# Patient Record
Sex: Female | Born: 1990 | Race: White | Hispanic: No | Marital: Single | Smoking: Never smoker
Health system: Southern US, Community
[De-identification: ages and names within clinical notes are randomized; demographics above are authoritative.]

## PROBLEM LIST (undated history)

## (undated) ENCOUNTER — Inpatient Hospital Stay: Payer: Self-pay

## (undated) DIAGNOSIS — Z3490 Encounter for supervision of normal pregnancy, unspecified, unspecified trimester: Secondary | ICD-10-CM

---
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
## 2010-04-10 ENCOUNTER — Ambulatory Visit: Payer: Self-pay | Admitting: Family Medicine

## 2010-04-22 ENCOUNTER — Emergency Department: Payer: Self-pay | Admitting: Emergency Medicine

## 2010-04-25 LAB — PATHOLOGY REPORT

## 2010-07-20 ENCOUNTER — Emergency Department: Payer: Self-pay | Admitting: Emergency Medicine

## 2010-08-18 ENCOUNTER — Emergency Department: Payer: Self-pay | Admitting: Emergency Medicine

## 2010-08-22 ENCOUNTER — Emergency Department: Payer: Self-pay | Admitting: Emergency Medicine

## 2010-10-17 ENCOUNTER — Emergency Department: Payer: Self-pay | Admitting: Emergency Medicine

## 2011-01-05 ENCOUNTER — Observation Stay: Payer: Self-pay | Admitting: Obstetrics and Gynecology

## 2011-02-22 ENCOUNTER — Observation Stay: Payer: Self-pay

## 2011-03-18 ENCOUNTER — Inpatient Hospital Stay: Payer: Self-pay | Admitting: Obstetrics and Gynecology

## 2011-09-04 ENCOUNTER — Emergency Department: Payer: Self-pay | Admitting: *Deleted

## 2011-09-04 LAB — URINALYSIS, COMPLETE
Bilirubin,UR: NEGATIVE
Ketone: NEGATIVE
Ph: 6 (ref 4.5–8.0)
Protein: NEGATIVE
RBC,UR: 2 /HPF (ref 0–5)
Squamous Epithelial: 16

## 2011-10-11 ENCOUNTER — Observation Stay: Payer: Self-pay

## 2011-10-11 LAB — URINALYSIS, COMPLETE
Bilirubin,UR: NEGATIVE
Blood: NEGATIVE
Glucose,UR: NEGATIVE mg/dL (ref 0–75)
RBC,UR: 1 /HPF (ref 0–5)
Specific Gravity: 1.001 (ref 1.003–1.030)

## 2011-10-11 LAB — CBC
HCT: 32.6 % — ABNORMAL LOW (ref 35.0–47.0)
HGB: 11.2 g/dL — ABNORMAL LOW (ref 12.0–16.0)
MCH: 33.4 pg (ref 26.0–34.0)
MCHC: 34.5 g/dL (ref 32.0–36.0)
MCV: 97 fL (ref 80–100)
Platelet: 250 10*3/uL (ref 150–440)
RBC: 3.37 10*6/uL — ABNORMAL LOW (ref 3.80–5.20)
WBC: 7.3 10*3/uL (ref 3.6–11.0)

## 2012-01-11 ENCOUNTER — Encounter (HOSPITAL_COMMUNITY): Payer: Self-pay | Admitting: *Deleted

## 2012-01-11 ENCOUNTER — Emergency Department (HOSPITAL_COMMUNITY): Payer: Medicaid Other

## 2012-01-11 ENCOUNTER — Emergency Department (HOSPITAL_COMMUNITY)
Admission: EM | Admit: 2012-01-11 | Discharge: 2012-01-11 | Disposition: A | Discharge status: Home / Self Care | Payer: Medicaid Other | Attending: Emergency Medicine | Admitting: Emergency Medicine

## 2012-01-11 DIAGNOSIS — H9209 Otalgia, unspecified ear: Secondary | ICD-10-CM | POA: Insufficient documentation

## 2012-01-11 DIAGNOSIS — R591 Generalized enlarged lymph nodes: Secondary | ICD-10-CM

## 2012-01-11 DIAGNOSIS — J029 Acute pharyngitis, unspecified: Secondary | ICD-10-CM | POA: Insufficient documentation

## 2012-01-11 DIAGNOSIS — R599 Enlarged lymph nodes, unspecified: Secondary | ICD-10-CM | POA: Insufficient documentation

## 2012-01-11 DIAGNOSIS — L989 Disorder of the skin and subcutaneous tissue, unspecified: Secondary | ICD-10-CM | POA: Insufficient documentation

## 2012-01-11 DIAGNOSIS — R6884 Jaw pain: Secondary | ICD-10-CM | POA: Insufficient documentation

## 2012-01-11 DIAGNOSIS — R259 Unspecified abnormal involuntary movements: Secondary | ICD-10-CM | POA: Insufficient documentation

## 2012-01-11 HISTORY — DX: Encounter for supervision of normal pregnancy, unspecified, unspecified trimester: Z34.90

## 2012-01-11 LAB — CBC WITH DIFFERENTIAL/PLATELET
Basophils Absolute: 0 10*3/uL (ref 0.0–0.1)
Basophils Relative: 0 % (ref 0–1)
Eosinophils Absolute: 0 10*3/uL (ref 0.0–0.7)
HCT: 32.3 % — ABNORMAL LOW (ref 36.0–46.0)
MCHC: 34.4 g/dL (ref 30.0–36.0)
Monocytes Absolute: 0.4 10*3/uL (ref 0.1–1.0)
Neutro Abs: 3.3 10*3/uL (ref 1.7–7.7)
RDW: 12.2 % (ref 11.5–15.5)

## 2012-01-11 LAB — BASIC METABOLIC PANEL
Calcium: 8.9 mg/dL (ref 8.4–10.5)
Chloride: 108 mEq/L (ref 96–112)
Creatinine, Ser: 0.58 mg/dL (ref 0.50–1.10)
GFR calc Af Amer: >90 mL/min (ref >90)
GFR calc non Af Amer: >90 mL/min (ref >90)

## 2012-01-11 MED ORDER — HYDROCODONE-ACETAMINOPHEN 5-325 MG PO TABS
1.0000 | ORAL_TABLET | Freq: Once | ORAL | Status: AC
  Administered 2012-01-11: 1 via ORAL
  Filled 2012-01-11: qty 1

## 2012-01-11 MED ORDER — HYDROCODONE-ACETAMINOPHEN 5-325 MG PO TABS: 1.0000 | ORAL_TABLET | ORAL | Status: AC | PRN

## 2012-01-11 NOTE — ED Provider Notes (Signed)
History     CSN: [id number]  Arrival date & time 01/11/12  1440   First MD Initiated Contact with Patient 01/11/12 1604      Chief Complaint  Patient presents with  . Abscess    (Consider location/radiation/quality/duration/timing/severity/associated sxs/prior treatment) HPI Comments: [name] presents with a swollen tender nodule which has been present behind her left ear for the past month and has had intermittent drainage from her left ear.  Over the past 3-4 days, she has had increased pain which is now radiating into her left jaw and down the left side of her throat which is worse with swallowing. She has had reduced PO intake secondary to pain.  She denies tongue or throat swelling and denies shortness of breath.   She cannot open her mouth because her jaw is painful.  She reports subjective fevers which she has not checked.  She has taken tylenol without relief of pain. She is currently 8.5 months pregnant and denies any problems with her pregnancy including current abdominal pain or cramping.    The history is provided by the patient.    Past Medical History  Diagnosis Date  . Pregnant     History reviewed. No pertinent past surgical history.  History reviewed. No pertinent family history.  History  Substance Use Topics  . Smoking status: Never Smoker   . Smokeless tobacco: Not on file  . Alcohol Use: No    OB History    Grav Para Term Preterm Abortions TAB SAB Ect Mult Living   1               Review of Systems  Constitutional: Positive for fever.  HENT: Positive for ear pain and sore throat. Negative for congestion, facial swelling, neck pain, dental problem and sinus pressure.   Eyes: Negative.   Respiratory: Negative for chest tightness and shortness of breath.   Cardiovascular: Negative for chest pain.  Gastrointestinal: Negative for nausea and abdominal pain.  Genitourinary: Negative.   Musculoskeletal: Negative for joint swelling and  arthralgias.  Skin: Negative.  Negative for rash and wound.  Neurological: Negative for dizziness, weakness, light-headedness, numbness and headaches.  Hematological: Negative.   Psychiatric/Behavioral: Negative.     Allergies  Review of patient's allergies indicates no known allergies.  Home Medications   Current Outpatient Rx  Name Route Sig Dispense Refill  . ACETAMINOPHEN 500 MG PO TABS Oral Take 500 mg by mouth every 6 (six) hours as needed. For pain    . PRENATAL PLUS 27-1 MG PO TABS Oral Take 1 tablet by mouth every morning.    Marland Kitchen HYDROCODONE-ACETAMINOPHEN 5-325 MG PO TABS Oral Take 1 tablet by mouth every 4 (four) hours as needed for pain. 15 tablet 0    BP 107/70  Pulse 81  Temp 97.6 F (36.4 C) (Oral)  Resp 20  Ht 5' (1.524 m)  Wt 124 lb (56.246 kg)  BMI 24.22 kg/m2  SpO2 99%  LMP 05/24/2011  Physical Exam  Nursing note and vitals reviewed. Constitutional: She appears well-developed and well-nourished.  HENT:  Head: Normocephalic and atraumatic.  Right Ear: Tympanic membrane, external ear and ear canal normal.  Left Ear: Tympanic membrane and ear canal normal. There is tenderness. There is mastoid tenderness.  Mouth/Throat: No oropharyngeal exudate.       Firm but mobile nodule over  left mastoid, non draining with no erythema. Also has small papule left scalp near border of hairline behind the left ear.  Patient unable to extend jaw more than 1 cm with ttp at left TM joint.  No edema.  Difficult to visualize tonsils competely , but no erythema or tonsillar exudate appreciated.  Eyes: Conjunctivae are normal.  Neck: Normal range of motion. No rigidity. No edema and no erythema present.  Cardiovascular: Normal rate, regular rhythm, normal heart sounds and intact distal pulses.   Pulmonary/Chest: Effort normal and breath sounds normal. She has no wheezes.  Abdominal: Soft. Bowel sounds are normal. There is no tenderness.  Musculoskeletal: Normal range of motion.    Neurological: She is alert.  Skin: Skin is warm and dry.  Psychiatric: She has a normal mood and affect.    ED Course  Procedures (including critical care time)  Labs Reviewed  CBC WITH DIFFERENTIAL - Abnormal; Notable for the following:    RBC 3.40 (*)     Hemoglobin 11.1 (*)     HCT 32.3 (*)     All other components within normal limits  BASIC METABOLIC PANEL - Abnormal; Notable for the following:    Potassium 3.4 (*)     BUN 3 (*)     All other components within normal limits   Ct Maxillofacial W/cm  01/11/2012  *RADIOLOGY REPORT*  Clinical Data: Trismus.  Adenopathy.  Palpable abnormality behind left ear.  Pain left-sided.  CT MAXILLOFACIAL WITH CONTRAST  Technique:  Multidetector CT imaging of the maxillofacial structures was performed with intravenous contrast. Multiplanar CT image reconstructions were also generated.  Contrast:  Omnipaque-300 IV  Comparison: None.  Findings: Peritonsillar soft tissues are symmetric and normal. Epiglottis is normal.  Left level II node measures 7 x 14 mm.  Right level II node measures 10.6 mm.  No low density or abscessed lymph nodes are present. 6 x 10 mm enhancing dermal lesion lateral to the mastoid tip may represent a lymph node which is inflamed.  No dermal abscess is present.  The orbit is intact bilaterally.  Paranasal sinuses are clear. Mastoid sinus is clear bilaterally.  Middle ears clear bilaterally. No acute bony change.  IMPRESSION: Shotty cervical adenopathy.  No pathologic lymph node or abscess is identified.  There is an enhancing dermal lesion lateral to the mastoid tip on the left which may be an inflamed lymph node.  Original Report Authenticated By: Camelia Phenes, M.D.     1. Jaw pain   2. Lymphadenopathy       MDM  Ct scan reviewed and discussed with radiologist and Dr Deretha Emory.  Ct scan negative for deep soft tissue abscess.  Pt's VSS in ed.  No evidence for infectious process at this time.  Pt was encouraged to contact  her obgyn to inform of this problem  But also referred to Dr Suszanne Conners for a recheck of her sx in 3 days.  However,  Advised to return here for a recheck sooner if her sx worsen in any way.  Hydrocodone prescribed with precautions.        Burgess Amor, [place] 01/12/12 [phone]

## 2012-01-11 NOTE — ED Notes (Signed)
Pt back from x-ray. Pt alert and oriented x 3. Skin warm and dry. Color pink. Pt is 8 1/2 months pregnant. No c/o cramping or bleeding. Pt states that she has been feeling the baby move today. Pt states that she has had a bump behind her left ear for a couple days and then started having pain in her mouth this morning and it is hard to open her jaws. Pt ambulating with no difficulty.

## 2012-01-11 NOTE — ED Notes (Signed)
Patient has small whitehead behind L ear at edge of hairline.  Slight swelling of lymph nodes on that side.  Difficulty opening mouth and painful swallowing.

## 2012-01-11 NOTE — ED Notes (Signed)
Swollen area behind lt ear, present over 1 month and drainage from lt ear. Pt is pregnant

## 2012-01-11 NOTE — ED Notes (Signed)
Transported patient via stretcher to RM 7 on acute side.

## 2012-01-12 ENCOUNTER — Observation Stay: Payer: Self-pay | Admitting: Obstetrics and Gynecology

## 2012-01-12 LAB — URINALYSIS, COMPLETE
Bacteria: NONE SEEN
Ketone: NEGATIVE
Leukocyte Esterase: NEGATIVE
Ph: 7 (ref 4.5–8.0)
Protein: NEGATIVE
RBC,UR: <1 /HPF (ref 0–5)
Transitional Epi: <1
WBC UR: NONE SEEN /HPF (ref 0–5)

## 2012-01-12 NOTE — ED Provider Notes (Signed)
Medical screening examination/treatment/procedure(s) were performed by non-physician practitioner and as supervising physician I was immediately available for consultation/collaboration.   Shelda Jakes, MD 01/12/12 1950

## 2012-02-09 ENCOUNTER — Inpatient Hospital Stay: Payer: Self-pay | Admitting: Obstetrics & Gynecology

## 2012-02-09 LAB — CBC WITH DIFFERENTIAL/PLATELET
Basophil #: 0 10*3/uL (ref 0.0–0.1)
Eosinophil #: 0 10*3/uL (ref 0.0–0.7)
MCH: 31.7 pg (ref 26.0–34.0)
Monocyte #: 0.4 x10 3/mm (ref 0.2–0.9)
Monocyte %: 5.8 %
Neutrophil %: 71.7 %
Platelet: 250 10*3/uL (ref 150–440)
RBC: 3.4 10*6/uL — ABNORMAL LOW (ref 3.80–5.20)
RDW: 11.6 % (ref 11.5–14.5)

## 2012-02-09 LAB — DRUG SCREEN, URINE
Amphetamines, Ur Screen: NEGATIVE (ref ?–1000)
Benzodiazepine, Ur Scrn: NEGATIVE (ref ?–200)
Cannabinoid 50 Ng, Ur ~~LOC~~: NEGATIVE (ref ?–50)
Cocaine Metabolite,Ur ~~LOC~~: NEGATIVE (ref ?–300)
MDMA (Ecstasy)Ur Screen: NEGATIVE (ref ?–500)
Phencyclidine (PCP) Ur S: NEGATIVE (ref ?–25)

## 2012-05-16 ENCOUNTER — Emergency Department (HOSPITAL_COMMUNITY): Payer: No Typology Code available for payment source

## 2012-05-16 ENCOUNTER — Encounter (HOSPITAL_COMMUNITY): Payer: Self-pay

## 2012-05-16 ENCOUNTER — Emergency Department (HOSPITAL_COMMUNITY)
Admission: EM | Admit: 2012-05-16 | Discharge: 2012-05-16 | Disposition: A | Discharge status: Home / Self Care | Payer: No Typology Code available for payment source | Attending: Emergency Medicine | Admitting: Emergency Medicine

## 2012-05-16 DIAGNOSIS — R0789 Other chest pain: Secondary | ICD-10-CM

## 2012-05-16 DIAGNOSIS — H9209 Otalgia, unspecified ear: Secondary | ICD-10-CM

## 2012-05-16 DIAGNOSIS — R05 Cough: Secondary | ICD-10-CM | POA: Insufficient documentation

## 2012-05-16 DIAGNOSIS — R109 Unspecified abdominal pain: Secondary | ICD-10-CM | POA: Insufficient documentation

## 2012-05-16 DIAGNOSIS — R071 Chest pain on breathing: Secondary | ICD-10-CM | POA: Insufficient documentation

## 2012-05-16 DIAGNOSIS — J40 Bronchitis, not specified as acute or chronic: Secondary | ICD-10-CM

## 2012-05-16 DIAGNOSIS — R059 Cough, unspecified: Secondary | ICD-10-CM | POA: Insufficient documentation

## 2012-05-16 DIAGNOSIS — Z3202 Encounter for pregnancy test, result negative: Secondary | ICD-10-CM | POA: Insufficient documentation

## 2012-05-16 LAB — PREGNANCY, URINE: Preg Test, Ur: NEGATIVE

## 2012-05-16 MED ORDER — AZITHROMYCIN 250 MG PO TABS: ORAL_TABLET | ORAL | Status: DC

## 2012-05-16 MED ORDER — AZITHROMYCIN 250 MG PO TABS
500.0000 mg | ORAL_TABLET | Freq: Once | ORAL | Status: AC
  Administered 2012-05-16: 500 mg via ORAL
  Filled 2012-05-16: qty 2

## 2012-05-16 MED ORDER — ALBUTEROL SULFATE (5 MG/ML) 0.5% IN NEBU
2.5000 mg | INHALATION_SOLUTION | Freq: Once | RESPIRATORY_TRACT | Status: AC
  Administered 2012-05-16: 2.5 mg via RESPIRATORY_TRACT
  Filled 2012-05-16: qty 0.5

## 2012-05-16 MED ORDER — IBUPROFEN 800 MG PO TABS
800.0000 mg | ORAL_TABLET | Freq: Once | ORAL | Status: AC
  Administered 2012-05-16: 800 mg via ORAL
  Filled 2012-05-16: qty 1

## 2012-05-16 NOTE — ED Provider Notes (Signed)
History     CSN: [id number]  Arrival date & time 05/16/12  0022   First MD Initiated Contact with Patient 05/16/12 [phone]      Chief Complaint  Patient presents with  . Rib Pain   . Otalgia  . Post-op Problem    (Consider location/radiation/quality/duration/timing/severity/associated sxs/prior treatment) HPI  [name] is a 21 y.o. female who presents to the Emergency Department complaining of cough, left rib pain, pain at her C section scar, and left ear pain. She denies fever, chills, nausea, vomiting, shortness of breath. She has taken no medicines.   History reviewed. No pertinent past medical history.  Past Surgical History  Procedure Date  . Cesarean section     No family history on file.  History  Substance Use Topics  . Smoking status: Never Smoker   . Smokeless tobacco: Not on file  . Alcohol Use: No    OB History    Grav Para Term Preterm Abortions TAB SAB Ect Mult Living   1               Review of Systems  Constitutional: Negative for fever.       10 Systems reviewed and are negative for acute change except as noted in the HPI.  HENT: Positive for ear pain. Negative for congestion.   Eyes: Negative for discharge and redness.  Respiratory: Positive for cough. Negative for shortness of breath.        Right rib pain  Cardiovascular: Negative for chest pain.  Gastrointestinal: Positive for abdominal pain. Negative for vomiting.  Musculoskeletal: Negative for back pain.  Skin: Negative for rash.  Neurological: Negative for syncope, numbness and headaches.  Psychiatric/Behavioral:       No behavior change.    Allergies  Review of patient's allergies indicates no known allergies.  Home Medications   Current Outpatient Rx  Name  Route  Sig  Dispense  Refill  . ACETAMINOPHEN 500 MG PO TABS   Oral   Take 500 mg by mouth every 6 (six) hours as needed. For pain         . AZITHROMYCIN 250 MG PO TABS      1 every day until finished.   4  tablet   0   . PRENATAL PLUS 27-1 MG PO TABS   Oral   Take 1 tablet by mouth every morning.           BP 138/79  Pulse 88  Temp 98.1 F (36.7 C) (Oral)  Resp 16  Ht 4\' 11"  (1.499 m)  Wt 110 lb (49.896 kg)  BMI 22.22 kg/m2  SpO2 100%  LMP 05/24/2011  Breastfeeding? Unknown  Physical Exam  Nursing note and vitals reviewed. Constitutional: She appears well-developed and well-nourished.       Awake, alert, nontoxic appearance.  HENT:  Head: Atraumatic.  Right Ear: External ear normal.  Left Ear: External ear normal.  Mouth/Throat: Oropharynx is clear and moist.  Eyes: Right eye exhibits no discharge. Left eye exhibits no discharge.  Neck: Neck supple.  Pulmonary/Chest: Effort normal. No respiratory distress. She has no wheezes. She has no rales. She exhibits tenderness.       Left costal margin tenderness to palpation. No crepitus.   Abdominal: Soft. There is no tenderness. There is no rebound.  Genitourinary:       C section scar well healed, no erythema, no drainage.   Musculoskeletal: She exhibits no tenderness.       Baseline  ROM, no obvious new focal weakness.  Neurological:       Mental status and motor strength appears baseline for patient and situation.  Skin: No rash noted.  Psychiatric: She has a normal mood and affect.    ED Course  Procedures (including critical care time)   Labs Reviewed  PREGNANCY, URINE   Dg Chest 2 View  05/16/2012  *RADIOLOGY REPORT*  Clinical Data: Cough, chest pain.  CHEST - 2 VIEW  Comparison: None.  Findings: Lungs are clear. No pleural effusion or pneumothorax. The cardiomediastinal contours are within normal limits. The visualized bones and soft tissues are without significant appreciable abnormality.  IMPRESSION: No radiographic evidence of acute cardiopulmonary process.   Original Report Authenticated By: Jearld Lesch, M.D.      1. Bronchitis   2. Chest wall pain   3. Earache       MDM  Patient with cough x  several days here with chest wall pain, ear ache and cough. Chest xray unremarkable. Given albuterol nebulizer treatment with improvement in cough. Initiated antibiotic therapy. Given ibuprofen for discomfort with improvement. Dx testing d/w pt.  Questions answered.  Verb understanding, agreeable to d/c home with outpt f/u.Pt stable in ED with no significant deterioration in condition.The patient appears reasonably screened and/or stabilized for discharge and I doubt any other medical condition or other [facility] requiring further screening, evaluation, or treatment in the ED at this time prior to discharge.  MDM Reviewed: nursing note and vitals Interpretation: x-ray            Nicoletta Dress. Colon Branch, MD 05/16/12 [phone]

## 2012-05-16 NOTE — ED Notes (Signed)
My c-section site from September 10th is bothering me. Left ear ache, ribs hurt per pt.

## 2012-08-01 ENCOUNTER — Emergency Department: Payer: Self-pay | Admitting: Emergency Medicine

## 2012-08-01 LAB — URINALYSIS, COMPLETE
Bacteria: NONE SEEN
Bilirubin,UR: NEGATIVE
Blood: NEGATIVE
Nitrite: NEGATIVE
Protein: 100
RBC,UR: 4 /HPF (ref 0–5)
Specific Gravity: 1.035 (ref 1.003–1.030)
WBC UR: 5 /HPF (ref 0–5)

## 2012-08-01 LAB — CBC
HCT: 39.8 % (ref 35.0–47.0)
MCH: 31.2 pg (ref 26.0–34.0)
MCV: 92 fL (ref 80–100)
RBC: 4.32 10*6/uL (ref 3.80–5.20)
RDW: 12.4 % (ref 11.5–14.5)

## 2012-08-01 LAB — COMPREHENSIVE METABOLIC PANEL
Alkaline Phosphatase: 88 U/L (ref 50–136)
BUN: 13 mg/dL (ref 7–18)
Bilirubin,Total: 2.5 mg/dL — ABNORMAL HIGH (ref 0.2–1.0)
Chloride: 107 mmol/L (ref 98–107)
Glucose: 105 mg/dL — ABNORMAL HIGH (ref 65–99)
SGOT(AST): 21 U/L (ref 15–37)
Sodium: 137 mmol/L (ref 136–145)

## 2012-08-23 ENCOUNTER — Encounter (HOSPITAL_COMMUNITY): Payer: Self-pay | Admitting: Emergency Medicine

## 2012-08-23 ENCOUNTER — Emergency Department (HOSPITAL_COMMUNITY)
Admission: EM | Admit: 2012-08-23 | Discharge: 2012-08-23 | Disposition: A | Discharge status: Home / Self Care | Payer: Self-pay | Attending: Emergency Medicine | Admitting: Emergency Medicine

## 2012-08-23 DIAGNOSIS — R51 Headache: Secondary | ICD-10-CM | POA: Insufficient documentation

## 2012-08-23 DIAGNOSIS — R42 Dizziness and giddiness: Secondary | ICD-10-CM | POA: Insufficient documentation

## 2012-08-23 DIAGNOSIS — R112 Nausea with vomiting, unspecified: Secondary | ICD-10-CM | POA: Insufficient documentation

## 2012-08-23 MED ORDER — DIPHENHYDRAMINE HCL 50 MG/ML IJ SOLN
25.0000 mg | Freq: Once | INTRAMUSCULAR | Status: AC
  Administered 2012-08-23: 25 mg via INTRAMUSCULAR
  Filled 2012-08-23: qty 1

## 2012-08-23 MED ORDER — METOCLOPRAMIDE HCL 5 MG/ML IJ SOLN
10.0000 mg | Freq: Once | INTRAMUSCULAR | Status: AC
  Administered 2012-08-23: 10 mg via INTRAMUSCULAR
  Filled 2012-08-23: qty 2

## 2012-08-23 MED ORDER — ONDANSETRON 8 MG PO TBDP
8.0000 mg | ORAL_TABLET | Freq: Once | ORAL | Status: AC
  Administered 2012-08-23: 8 mg via ORAL
  Filled 2012-08-23: qty 1

## 2012-08-23 MED ORDER — KETOROLAC TROMETHAMINE 60 MG/2ML IM SOLN
60.0000 mg | Freq: Once | INTRAMUSCULAR | Status: AC
  Administered 2012-08-23: 60 mg via INTRAMUSCULAR
  Filled 2012-08-23: qty 2

## 2012-08-23 MED ORDER — ONDANSETRON 4 MG PO TBDP: 4.0000 mg | ORAL_TABLET | Freq: Three times a day (TID) | ORAL | Status: DC | PRN

## 2012-08-23 NOTE — ED Notes (Addendum)
Patient appears flushed, still c/o being cold. Also c/o sore throat.  Temp rechecked  101.4   Red throat with drainage at the back noted on inspection.  C/o back pain and generalized body aches

## 2012-08-23 NOTE — ED Provider Notes (Signed)
History     CSN: [id number]  Arrival date & time 08/23/12  0327   First MD Initiated Contact with Patient 08/23/12 0410      Chief Complaint  Patient presents with  . Dizziness  . Emesis  . Headache    (Consider location/radiation/quality/duration/timing/severity/associated sxs/prior treatment) HPI [name] is a 22 y.o. female who presents to the Emergency Department complaining of headache and development of nausea and vomiting x 4 that began yesterday. Headache is across the frontal area, with no vision changes. Denies fever, chills. Diarrhea, shortness of breath. History reviewed. No pertinent past medical history.  Past Surgical History  Procedure Laterality Date  . Cesarean section      History reviewed. No pertinent family history.  History  Substance Use Topics  . Smoking status: Never Smoker   . Smokeless tobacco: Not on file  . Alcohol Use: No    OB History   Grav Para Term Preterm Abortions TAB SAB Ect Mult Living   1               Review of Systems  Constitutional: Negative for fever.       10 Systems reviewed and are negative for acute change except as noted in the HPI.  HENT: Negative for congestion.   Eyes: Negative for discharge and redness.  Respiratory: Negative for cough and shortness of breath.   Cardiovascular: Negative for chest pain.  Gastrointestinal: Positive for nausea and vomiting. Negative for abdominal pain.  Musculoskeletal: Negative for back pain.  Skin: Negative for rash.  Neurological: Positive for headaches. Negative for syncope and numbness.  Psychiatric/Behavioral:       No behavior change.    Allergies  Review of patient's allergies indicates no known allergies.  Home Medications   Current Outpatient Rx  Name  Route  Sig  Dispense  Refill  . acetaminophen (TYLENOL) 500 MG tablet   Oral   Take 500 mg by mouth every 6 (six) hours as needed. For pain         . azithromycin (ZITHROMAX) 250 MG tablet      1  every day until finished.   4 tablet   0   . prenatal vitamin w/FE, FA (PRENATAL 1 + 1) 27-1 MG TABS   Oral   Take 1 tablet by mouth every morning.           BP 108/62  Pulse 97  Temp(Src) 98.4 F (36.9 C) (Oral)  Resp 18  Ht 4\' 11"  (1.499 m)  SpO2 100%  LMP 08/20/2012  Physical Exam  Nursing note and vitals reviewed. Constitutional: She appears well-developed and well-nourished.  Awake, alert, nontoxic appearance.  HENT:  Head: Normocephalic and atraumatic.  chelitis  Eyes: EOM are normal. Pupils are equal, round, and reactive to light. Right eye exhibits no discharge. Left eye exhibits no discharge.  Neck: Neck supple.  Cardiovascular: Normal rate.   Murmur heard. Pulmonary/Chest: Effort normal and breath sounds normal. She exhibits no tenderness.  Abdominal: Soft. There is no tenderness. There is no rebound.  Hyperactive bowel sounds   Musculoskeletal: She exhibits no tenderness.  Baseline ROM, no obvious new focal weakness.  Neurological:  Mental status and motor strength appears baseline for patient and situation.  Skin: No rash noted.  Psychiatric: She has a normal mood and affect.    ED Course  Procedures (including critical care time)     MDM  Patient with headache, nausea, vomiting. Given benadryl, tramadol, reglan and zofran  with relief of the headache and nausea. Pt stable in ED with no significant deterioration in condition.The patient appears reasonably screened and/or stabilized for discharge and I doubt any other medical condition or other [facility] requiring further screening, evaluation, or treatment in the ED at this time prior to discharge.  MDM Reviewed: nursing note and vitals           Nicoletta Dress. Colon Branch, MD 08/23/12 [phone]

## 2012-08-23 NOTE — ED Notes (Signed)
Pt c/o headache, vomiting and dizziness that started yesterday.

## 2012-08-26 ENCOUNTER — Encounter (HOSPITAL_COMMUNITY): Payer: Self-pay | Admitting: *Deleted

## 2012-08-26 ENCOUNTER — Emergency Department (HOSPITAL_COMMUNITY)
Admission: EM | Admit: 2012-08-26 | Discharge: 2012-08-26 | Disposition: A | Discharge status: Home / Self Care | Payer: Self-pay | Attending: Emergency Medicine | Admitting: Emergency Medicine

## 2012-08-26 DIAGNOSIS — Z3202 Encounter for pregnancy test, result negative: Secondary | ICD-10-CM | POA: Insufficient documentation

## 2012-08-26 DIAGNOSIS — B009 Herpesviral infection, unspecified: Secondary | ICD-10-CM | POA: Insufficient documentation

## 2012-08-26 DIAGNOSIS — R509 Fever, unspecified: Secondary | ICD-10-CM | POA: Insufficient documentation

## 2012-08-26 LAB — URINALYSIS, ROUTINE W REFLEX MICROSCOPIC
Ketones, ur: >80 mg/dL — AB
Nitrite: NEGATIVE
Protein, ur: 100 mg/dL — AB
Specific Gravity, Urine: 1.025 (ref 1.005–1.030)
Urobilinogen, UA: 1 mg/dL (ref 0.0–1.0)

## 2012-08-26 LAB — URINE MICROSCOPIC-ADD ON

## 2012-08-26 LAB — RAPID STREP SCREEN (MED CTR MEBANE ONLY): Streptococcus, Group A Screen (Direct): NEGATIVE

## 2012-08-26 MED ORDER — ACETAMINOPHEN 325 MG PO TABS
650.0000 mg | ORAL_TABLET | Freq: Once | ORAL | Status: AC
  Administered 2012-08-26: 650 mg via ORAL
  Filled 2012-08-26: qty 2

## 2012-08-26 MED ORDER — ACYCLOVIR 400 MG PO TABS: 400.0000 mg | ORAL_TABLET | Freq: Four times a day (QID) | ORAL | Status: DC

## 2012-08-26 NOTE — ED Provider Notes (Signed)
History     CSN: [id number]  Arrival date & time 08/26/12  0247   First MD Initiated Contact with Patient 08/26/12 [phone]      Chief Complaint  Patient presents with  . Sore Throat  . Abdominal Pain    (Consider location/radiation/quality/duration/timing/severity/associated sxs/prior treatment) HPI [name] is a 22 y.o. female who presents to the Emergency Department complaining of sore throat since Monday stating her throat is so swollen she [place] unable to talk. She was seen here on Tuesday for viral illness characterized by nausea and vomiting. Nausea nd vomiting have resolved. Now with sore throat. She has what appears to be herpes simplex on the left side of her face.  History reviewed. No pertinent past medical history.  Past Surgical History  Procedure Laterality Date  . Cesarean section      History reviewed. No pertinent family history.  History  Substance Use Topics  . Smoking status: Never Smoker   . Smokeless tobacco: Not on file  . Alcohol Use: No    OB History   Grav Para Term Preterm Abortions TAB SAB Ect Mult Living   1               Review of Systems  Constitutional: Positive for fever.       10 Systems reviewed and are negative for acute change except as noted in the HPI.  HENT: Negative for congestion.        Dried lips, lesions to the left side of lip  Eyes: Negative for discharge and redness.  Respiratory: Negative for cough and shortness of breath.   Cardiovascular: Negative for chest pain.  Gastrointestinal: Negative for vomiting and abdominal pain.  Musculoskeletal: Negative for back pain.  Skin: Negative for rash.  Neurological: Negative for syncope, numbness and headaches.  Psychiatric/Behavioral:       No behavior change.    Allergies  Review of patient's allergies indicates no known allergies.  Home Medications   Current Outpatient Rx  Name  Route  Sig  Dispense  Refill  . acetaminophen (TYLENOL) 500 MG tablet   Oral  Take 500 mg by mouth every 6 (six) hours as needed. For pain         . azithromycin (ZITHROMAX) 250 MG tablet      1 every day until finished.   4 tablet   0   . ondansetron (ZOFRAN ODT) 4 MG disintegrating tablet   Oral   Take 1 tablet (4 mg total) by mouth every 8 (eight) hours as needed for nausea.   20 tablet   0   . prenatal vitamin w/FE, FA (PRENATAL 1 + 1) 27-1 MG TABS   Oral   Take 1 tablet by mouth every morning.           BP 133/70  Pulse 96  Temp(Src) 99.6 F (37.6 C) (Oral)  Resp 22  Ht 4\' 11"  (1.499 m)  Wt 110 lb (49.896 kg)  BMI 22.21 kg/m2  SpO2 98%  LMP 08/20/2012  Physical Exam  Nursing note and vitals reviewed. Constitutional:  Awake, alert, nontoxic appearance.  HENT:  Head: Atraumatic.  Chelitis; herpes simplex to left side of mouth. Adenopathy bilateral.  Eyes: EOM are normal. Pupils are equal, round, and reactive to light. Right eye exhibits no discharge. Left eye exhibits no discharge.  Neck: Neck supple.  Cardiovascular: Normal rate and intact distal pulses.   Pulmonary/Chest: Effort normal and breath sounds normal. She exhibits no tenderness.  Abdominal:  Soft. Bowel sounds are normal. There is no tenderness. There is no rebound.  Musculoskeletal: She exhibits no tenderness.  Baseline ROM, no obvious new focal weakness.  Neurological:  Mental status and motor strength appears baseline for patient and situation.  Skin: No rash noted.  Psychiatric: She has a normal mood and affect.    ED Course  Procedures (including critical care time) Results for orders placed during the hospital encounter of 08/26/12  RAPID STREP SCREEN      Result Value Range   Streptococcus, Group A Screen (Direct) NEGATIVE  NEGATIVE  URINALYSIS, ROUTINE W REFLEX MICROSCOPIC      Result Value Range   Color, Urine YELLOW  YELLOW   APPearance CLEAR  CLEAR   Specific Gravity, Urine 1.025  1.005 - 1.030   pH 6.0  5.0 - 8.0   Glucose, UA NEGATIVE  NEGATIVE mg/dL    Hgb urine dipstick SMALL (*) NEGATIVE   Bilirubin Urine MODERATE (*) NEGATIVE   Ketones, ur >80 (*) NEGATIVE mg/dL   Protein, ur 409 (*) NEGATIVE mg/dL   Urobilinogen, UA 1.0  0.0 - 1.0 mg/dL   Nitrite NEGATIVE  NEGATIVE   Leukocytes, UA NEGATIVE  NEGATIVE  PREGNANCY, URINE      Result Value Range   Preg Test, Ur NEGATIVE  NEGATIVE  URINE MICROSCOPIC-ADD ON      Result Value Range   Squamous Epithelial / LPF MANY (*) RARE   WBC, UA 0-2  <3 WBC/hpf   RBC / HPF 0-2  <3 RBC/hpf   Bacteria, UA FEW (*) RARE      MDM  Patient with sore throat and swollen glands. Strep is negative. Patient has finished a Z pack. 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 Will treat her with acyclovir. Pt stable in ED with no significant deterioration in condition.The patient appears reasonably screened and/or stabilized for discharge and I doubt any other medical condition or other [facility] requiring further screening, evaluation, or treatment in the ED at this time prior to discharge.  MDM Reviewed: nursing note and vitals Interpretation: labs           Nicoletta Dress. Colon Branch, MD 08/26/12 [phone]

## 2012-08-26 NOTE — ED Notes (Signed)
Pt co sore throat and abdominal pain since Monday, pt states throat is swelling and she can't talk.

## 2013-05-14 IMAGING — CT CT MAXILLOFACIAL W/ CM
2 of 8 series · 15 of 47 positions shown, 18 images · IV contrast (Omnipaque 300)
Comparison: None.

CLINICAL DATA: Trismus.  Adenopathy.  Palpable abnormality behind
left ear.  Pain left-sided.

CT MAXILLOFACIAL WITH CONTRAST
TECHNIQUE: Multidetector CT imaging of the maxillofacial
structures was performed with intravenous contrast. Multiplanar CT
image reconstructions were also generated.
Contrast:  Pmnipaque-IEE IV

[Series 2: max st axial · axial · 0.27mm/px · z∈[+20,+144]mm · 12 of 74 slices shown, 15 images]
[im 6/74  brain]
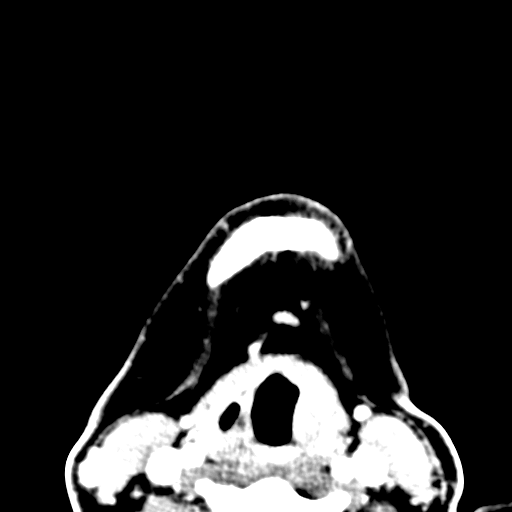
[im 6/74  bone]
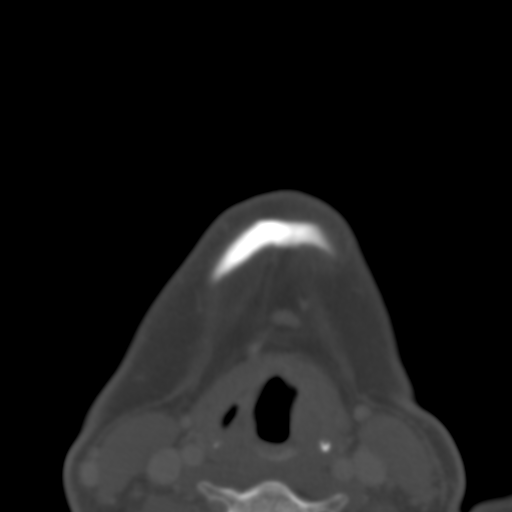
[im 11/74  bone]
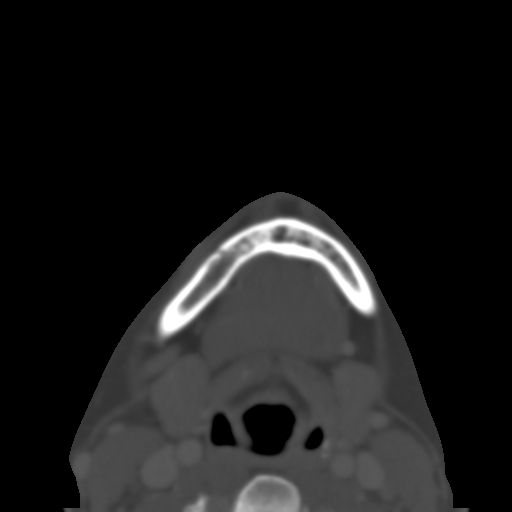
[im 16/74  bone]
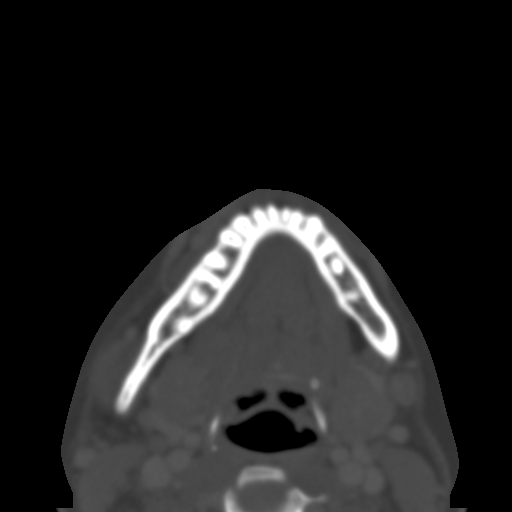
[im 21/74  bone]
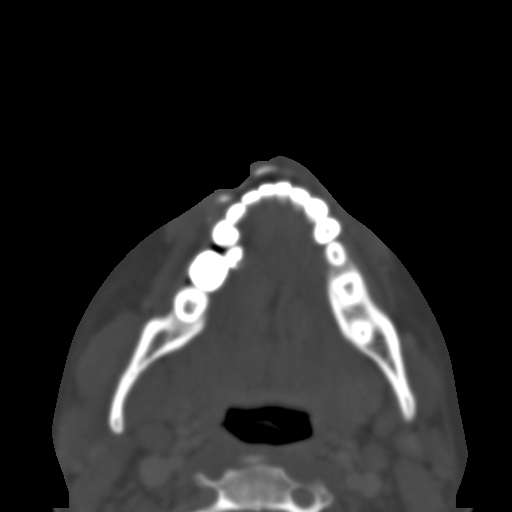
[im 27/74  brain]
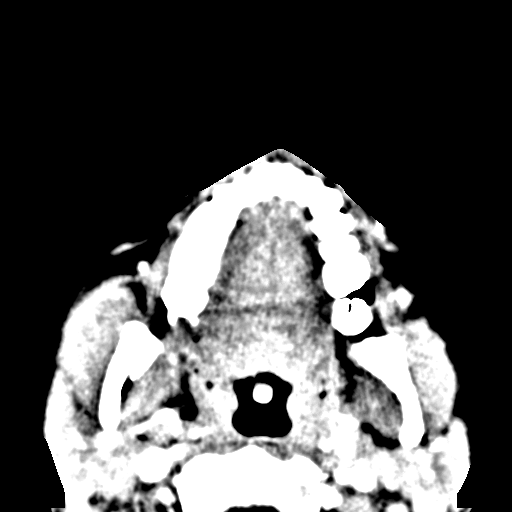
[im 27/74  bone]
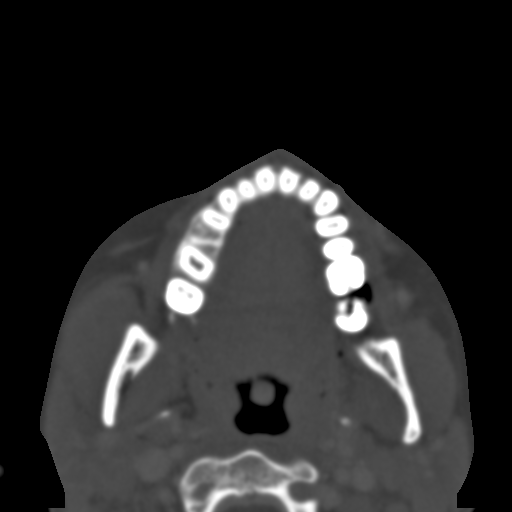
[im 32/74  bone]
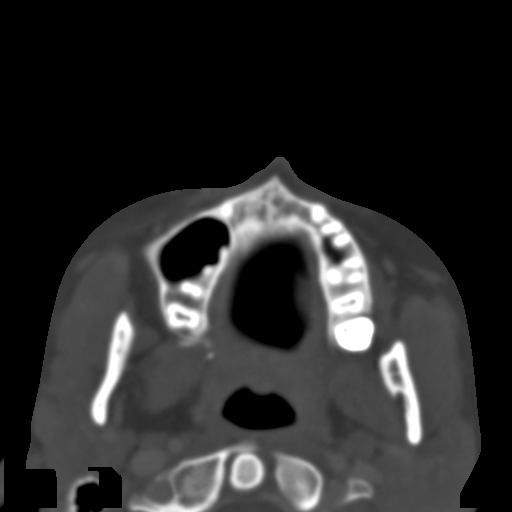
[im 42/74  bone]
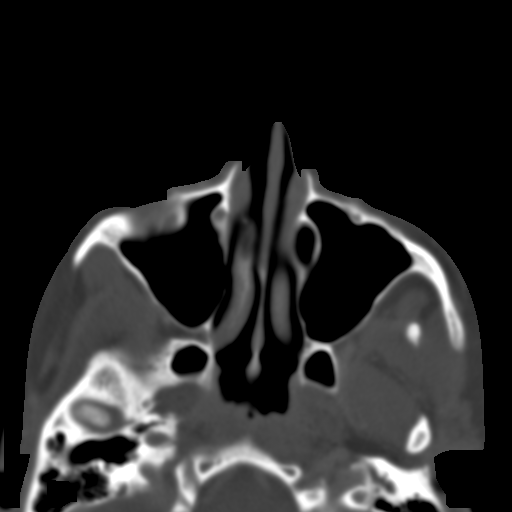
[im 47/74  bone]
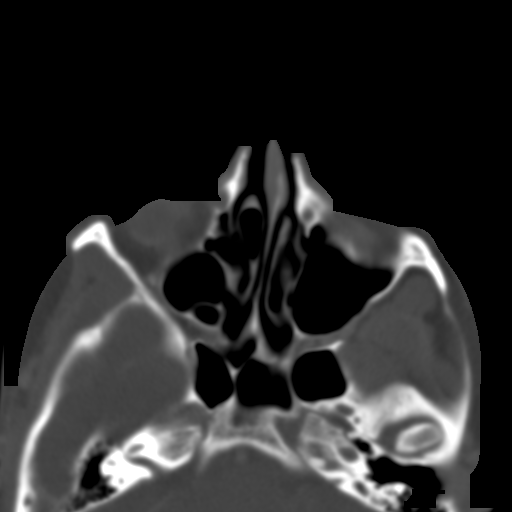
[im 53/74  brain]
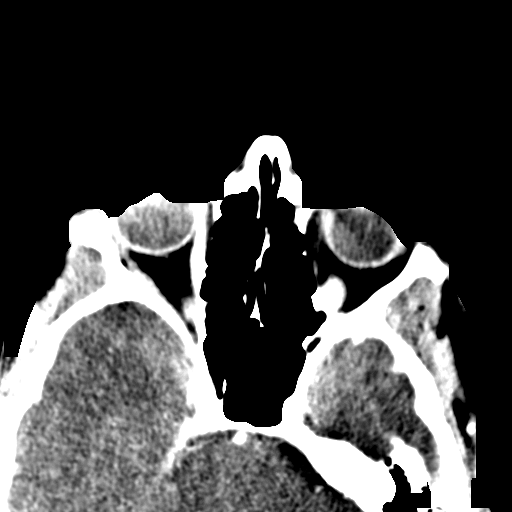
[im 53/74  bone]
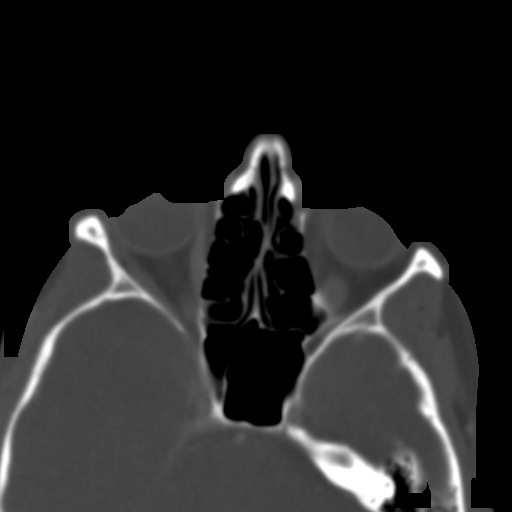
[im 58/74  bone]
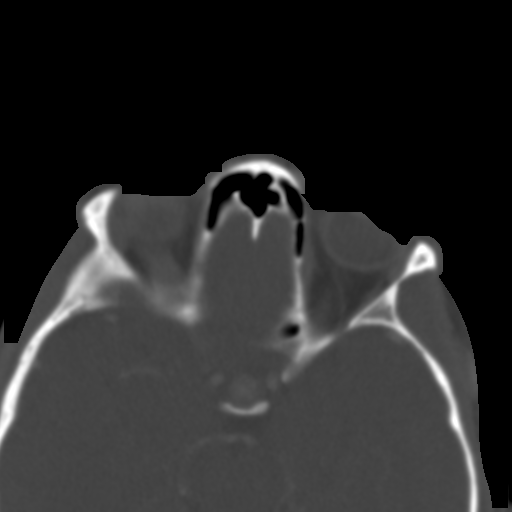
[im 63/74  bone]
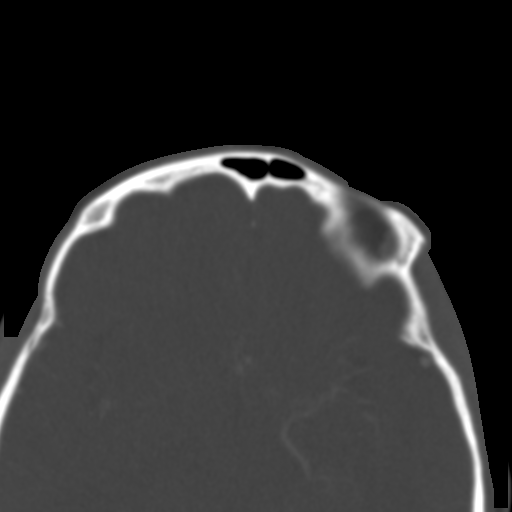
[im 68/74  bone]
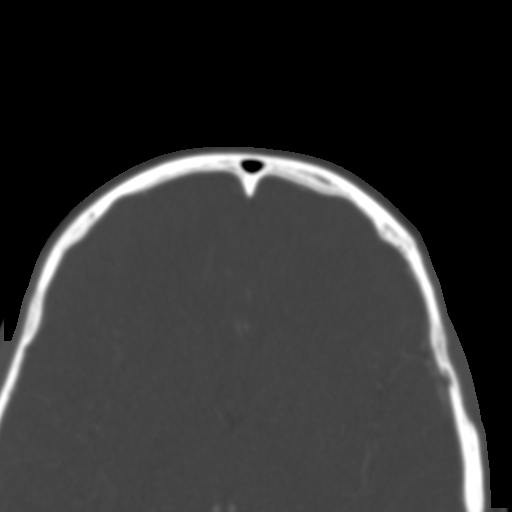

[Series 8: max st coro · coronal · 0.27mm/px · 3 of 58 slices shown]
[im 15/58  bone]
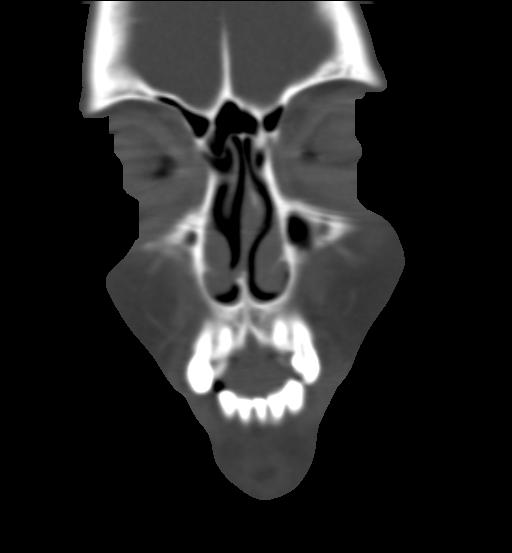
[im 29/58  bone]
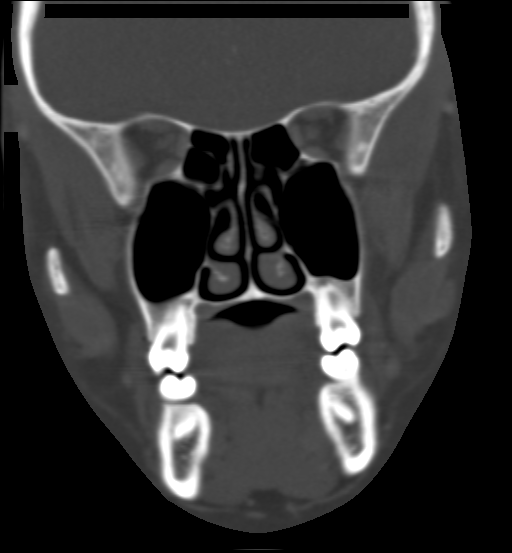
[im 43/58  bone]
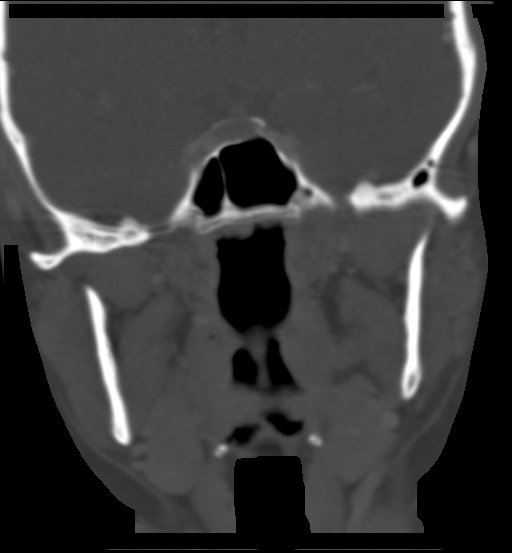

[15 of 47 positions shown; findings below may reference images not displayed]

FINDINGS: Peritonsillar soft tissues are symmetric and normal.
Epiglottis is normal.

Left level II node measures 7 x 14 mm.  Right level II node
measures 10.6 mm.  No low density or abscessed lymph nodes are
present. 6 x 10 mm enhancing dermal lesion lateral to the mastoid
tip may represent a lymph node which is inflamed.  No dermal
abscess is present.

The orbit is intact bilaterally.  Paranasal sinuses are clear.
Mastoid sinus is clear bilaterally.  Middle ears clear bilaterally.
No acute bony change.
IMPRESSION: Shotty cervical adenopathy.  No pathologic lymph node or abscess is
identified.  There is an enhancing dermal lesion lateral to the
mastoid tip on the left which may be an inflamed lymph node.

## 2014-04-02 ENCOUNTER — Encounter (HOSPITAL_COMMUNITY): Payer: Self-pay | Admitting: *Deleted

## 2014-08-15 ENCOUNTER — Encounter: Payer: Self-pay | Admitting: Women's Health

## 2014-09-08 ENCOUNTER — Emergency Department (HOSPITAL_COMMUNITY): Payer: BLUE CROSS/BLUE SHIELD

## 2014-09-08 ENCOUNTER — Emergency Department (HOSPITAL_COMMUNITY)
Admission: EM | Admit: 2014-09-08 | Discharge: 2014-09-08 | Disposition: A | Discharge status: Home / Self Care | Payer: BLUE CROSS/BLUE SHIELD | Attending: Emergency Medicine | Admitting: Emergency Medicine

## 2014-09-08 ENCOUNTER — Encounter (HOSPITAL_COMMUNITY): Payer: Self-pay

## 2014-09-08 DIAGNOSIS — Z3A00 Weeks of gestation of pregnancy not specified: Secondary | ICD-10-CM | POA: Insufficient documentation

## 2014-09-08 DIAGNOSIS — O9989 Other specified diseases and conditions complicating pregnancy, childbirth and the puerperium: Secondary | ICD-10-CM | POA: Diagnosis not present

## 2014-09-08 DIAGNOSIS — O99281 Endocrine, nutritional and metabolic diseases complicating pregnancy, first trimester: Secondary | ICD-10-CM | POA: Diagnosis not present

## 2014-09-08 DIAGNOSIS — R1032 Left lower quadrant pain: Secondary | ICD-10-CM | POA: Insufficient documentation

## 2014-09-08 DIAGNOSIS — Z79899 Other long term (current) drug therapy: Secondary | ICD-10-CM | POA: Diagnosis not present

## 2014-09-08 DIAGNOSIS — R197 Diarrhea, unspecified: Secondary | ICD-10-CM

## 2014-09-08 DIAGNOSIS — O210 Mild hyperemesis gravidarum: Secondary | ICD-10-CM | POA: Insufficient documentation

## 2014-09-08 DIAGNOSIS — E860 Dehydration: Secondary | ICD-10-CM | POA: Diagnosis not present

## 2014-09-08 DIAGNOSIS — Z3490 Encounter for supervision of normal pregnancy, unspecified, unspecified trimester: Secondary | ICD-10-CM

## 2014-09-08 DIAGNOSIS — R112 Nausea with vomiting, unspecified: Secondary | ICD-10-CM

## 2014-09-08 LAB — CBC WITH DIFFERENTIAL/PLATELET
BASOS ABS: 0 10*3/uL (ref 0.0–0.1)
Basophils Relative: 0 % (ref 0–1)
EOS ABS: 0.2 10*3/uL (ref 0.0–0.7)
EOS PCT: 2 % (ref 0–5)
HEMATOCRIT: 37.5 % (ref 36.0–46.0)
HEMOGLOBIN: 12.2 g/dL (ref 12.0–15.0)
LYMPHS PCT: 26 % (ref 12–46)
Lymphs Abs: 2.3 10*3/uL (ref 0.7–4.0)
MCH: 31.4 pg (ref 26.0–34.0)
MCHC: 32.5 g/dL (ref 30.0–36.0)
MCV: 96.4 fL (ref 78.0–100.0)
MONOS PCT: 9 % (ref 3–12)
Monocytes Absolute: 0.8 10*3/uL (ref 0.1–1.0)
NEUTROS PCT: 63 % (ref 43–77)
Neutro Abs: 5.8 10*3/uL (ref 1.7–7.7)
Platelets: 258 10*3/uL (ref 150–400)
RBC: 3.89 MIL/uL (ref 3.87–5.11)
RDW: 12 % (ref 11.5–15.5)
WBC: 9.1 10*3/uL (ref 4.0–10.5)

## 2014-09-08 LAB — URINE MICROSCOPIC-ADD ON

## 2014-09-08 LAB — COMPREHENSIVE METABOLIC PANEL
ALK PHOS: 73 U/L (ref 39–117)
ALT: 15 U/L (ref 0–35)
AST: 18 U/L (ref 0–37)
Albumin: 4 g/dL (ref 3.5–5.2)
Anion gap: 8 (ref 5–15)
BUN: 15 mg/dL (ref 6–23)
CALCIUM: 8.5 mg/dL (ref 8.4–10.5)
CHLORIDE: 104 mmol/L (ref 96–112)
CO2: 24 mmol/L (ref 19–32)
CREATININE: 0.64 mg/dL (ref 0.50–1.10)
GFR calc Af Amer: >90 mL/min (ref >90)
GLUCOSE: 99 mg/dL (ref 70–99)
POTASSIUM: 3.7 mmol/L (ref 3.5–5.1)
Sodium: 136 mmol/L (ref 135–145)
TOTAL PROTEIN: 6.9 g/dL (ref 6.0–8.3)
Total Bilirubin: 0.9 mg/dL (ref 0.3–1.2)

## 2014-09-08 LAB — URINALYSIS, ROUTINE W REFLEX MICROSCOPIC
BILIRUBIN URINE: NEGATIVE
Glucose, UA: NEGATIVE mg/dL
Ketones, ur: NEGATIVE mg/dL
Leukocytes, UA: NEGATIVE
Nitrite: NEGATIVE
Protein, ur: NEGATIVE mg/dL
Specific Gravity, Urine: 1.02 (ref 1.005–1.030)
Urobilinogen, UA: 0.2 mg/dL (ref 0.0–1.0)
pH: 6 (ref 5.0–8.0)

## 2014-09-08 LAB — HCG, QUANTITATIVE, PREGNANCY: hCG, Beta Chain, Quant, S: 646 m[IU]/mL — ABNORMAL HIGH (ref <5)

## 2014-09-08 LAB — ABO/RH: ABO/RH(D): O POS

## 2014-09-08 LAB — PREGNANCY, URINE: PREG TEST UR: POSITIVE — AB

## 2014-09-08 MED ORDER — SODIUM CHLORIDE 0.9 % IV SOLN
1000.0000 mL | INTRAVENOUS | Status: DC
  Administered 2014-09-08: 1000 mL via INTRAVENOUS

## 2014-09-08 MED ORDER — SODIUM CHLORIDE 0.9 % IV SOLN
1000.0000 mL | Freq: Once | INTRAVENOUS | Status: AC
  Administered 2014-09-08: 1000 mL via INTRAVENOUS

## 2014-09-08 MED ORDER — ONDANSETRON 4 MG PO TBDP: ORAL_TABLET | ORAL | Status: DC

## 2014-09-08 MED ORDER — ONDANSETRON HCL 4 MG/2ML IJ SOLN
4.0000 mg | Freq: Once | INTRAMUSCULAR | Status: AC
  Administered 2014-09-08: 4 mg via INTRAVENOUS

## 2014-09-08 MED ORDER — ONDANSETRON HCL 4 MG/2ML IJ SOLN
INTRAMUSCULAR | Status: AC
  Administered 2014-09-08: 4 mg
  Filled 2014-09-08: qty 2

## 2014-09-08 NOTE — ED Provider Notes (Signed)
CSN: [id number]     Arrival date & time 09/08/14  0043 History   First MD Initiated Contact with Patient 09/08/14 0143     Chief Complaint  Patient presents with  . Emesis  . Diarrhea     (Consider location/radiation/quality/duration/timing/severity/associated sxs/prior Treatment) HPI  Patient reports she started having abdominal pain on April 7. She states she has left lower quadrant pain that comes and goes. She states that last a few minutes at a time. She states it's sharp and dull. She has had nausea and vomiting about twice a day daily. She has had about 4 episodes of diarrhea daily. She denies fever or feeling dizzy, lightheaded, or weak. She denies eating anything that she thinks made her ill or being around anybody that she knows is ill.  PCP none  History reviewed. No pertinent past medical history. Past Surgical History  Procedure Laterality Date  . Cesarean section     No family history on file. History  Substance Use Topics  . Smoking status: Never Smoker   . Smokeless tobacco: Not on file  . Alcohol Use: No   employed  OB History    Gravida Para Term Preterm AB TAB SAB Ectopic Multiple Living   1              Review of Systems  All other systems reviewed and are negative.     Allergies  Review of patient's allergies indicates no known allergies.  Home Medications   Prior to Admission medications   Medication Sig Start Date End Date Taking? Authorizing Provider  acetaminophen (TYLENOL) 500 MG tablet Take 500 mg by mouth every 6 (six) hours as needed. For pain    Historical Provider, MD  acyclovir (ZOVIRAX) 400 MG tablet Take 1 tablet (400 mg total) by mouth 4 (four) times daily. 08/26/12   Annamarie Dawley, MD  azithromycin (ZITHROMAX) 250 MG tablet 1 every day until finished. 05/16/12   Annamarie Dawley, MD  ondansetron (ZOFRAN ODT) 4 MG disintegrating tablet Take 1 tablet (4 mg total) by mouth every 8 (eight) hours as needed for nausea. 08/23/12   Annamarie Dawley, MD  prenatal vitamin w/FE, FA (PRENATAL 1 + 1) 27-1 MG TABS Take 1 tablet by mouth every morning.    Historical Provider, MD   BP 111/65 mmHg  Pulse 81  Temp(Src) 98.1 F (36.7 C) (Oral)  Resp 14  Ht  (1.499 m)  Wt 115 lb (52.164 kg)  BMI 23.21 kg/m2  SpO2 100%  LMP 08/04/2014  Vital signs normal   Physical Exam  Constitutional: She is oriented to person, place, and time. She appears well-developed and well-nourished.  Non-toxic appearance. She does not appear ill. No distress.  HENT:  Head: Normocephalic and atraumatic.  Right Ear: External ear normal.  Left Ear: External ear normal.  Nose: Nose normal. No mucosal edema or rhinorrhea.  Mouth/Throat: Oropharynx is clear and moist and mucous membranes are normal. No dental abscesses or uvula swelling.  Eyes: Conjunctivae and EOM are normal. Pupils are equal, round, and reactive to light.  Neck: Normal range of motion and full passive range of motion without pain. Neck supple.  Cardiovascular: Normal rate, regular rhythm and normal heart sounds.  Exam reveals no gallop and no friction rub.   No murmur heard. Pulmonary/Chest: Effort normal and breath sounds normal. No respiratory distress. She has no wheezes. She has no rhonchi. She has no rales. She exhibits no tenderness and no crepitus.  Abdominal: Soft. Normal appearance and bowel sounds are normal. She exhibits no distension. There is tenderness. There is no rebound and no guarding.    Musculoskeletal: Normal range of motion. She exhibits no edema or tenderness.  Moves all extremities well.   Neurological: She is alert and oriented to person, place, and time. She has normal strength. No cranial nerve deficit.  Skin: Skin is warm, dry and intact. No rash noted. No erythema. No pallor.  Psychiatric: She has a normal mood and affect. Her speech is normal and behavior is normal. Her mood appears not anxious.  Nursing note and vitals reviewed.   ED Course    Procedures (including critical care time)  Medications  0.9 %  sodium chloride infusion (0 mLs Intravenous Stopped 09/08/14 0303)  0.9 %  sodium chloride infusion (0 mLs Intravenous Stopped 09/08/14 0300)    Followed by  0.9 %  sodium chloride infusion (0 mLs Intravenous Stopped 09/08/14 0200)    Followed by  0.9 %  sodium chloride infusion (1,000 mLs Intravenous New Bag/Given 09/08/14 0300)  ondansetron (ZOFRAN) injection 4 mg (4 mg Intravenous Given 09/08/14 0133)  ondansetron (ZOFRAN) 4 MG/2ML injection (4 mg  Given 09/08/14 0451)    Patient was given IV fluids and nausea medicine. At some point she did relay her last normal period was around February 14. In March her period was much shorter than usual and was only 2 days long. She states she is G4 P2 Ab1. She states she thinks she may be O- but she's not sure if she's ever had a rhogam shot with her other pregnancies (she talks about being tested for antibodies and that she was tested for chickenpox and she was immune). She continues to complain of pain in her left abdomen. Ultrasound of her pelvis was ordered. She will be left at the change of shift with Dr Estell Harpin to get her [place] results.   Labs Review Results for orders placed or performed during the hospital encounter of 09/08/14  Urinalysis, Routine w reflex microscopic  Result Value Ref Range   Color, Urine YELLOW YELLOW   APPearance CLOUDY (A) CLEAR   Specific Gravity, Urine 1.020 1.005 - 1.030   pH 6.0 5.0 - 8.0   Glucose, UA NEGATIVE NEGATIVE mg/dL   Hgb urine dipstick TRACE (A) NEGATIVE   Bilirubin Urine NEGATIVE NEGATIVE   Ketones, ur NEGATIVE NEGATIVE mg/dL   Protein, ur NEGATIVE NEGATIVE mg/dL   Urobilinogen, UA 0.2 0.0 - 1.0 mg/dL   Nitrite NEGATIVE NEGATIVE   Leukocytes, UA NEGATIVE NEGATIVE  Comprehensive metabolic panel  Result Value Ref Range   Sodium 136 135 - 145 mmol/L   Potassium 3.7 3.5 - 5.1 mmol/L   Chloride 104 96 - 112 mmol/L   CO2 24 19 - 32 mmol/L   Glucose,  Bld 99 70 - 99 mg/dL   BUN 15 6 - 23 mg/dL   Creatinine, Ser 1.91 0.50 - 1.10 mg/dL   Calcium 8.5 8.4 - 47.8 mg/dL   Total Protein 6.9 6.0 - 8.3 g/dL   Albumin 4.0 3.5 - 5.2 g/dL   AST 18 0 - 37 U/L   ALT 15 0 - 35 U/L   Alkaline Phosphatase 73 39 - 117 U/L   Total Bilirubin 0.9 0.3 - 1.2 mg/dL   GFR calc non Af Amer >90 >90 mL/min   GFR calc Af Amer >90 >90 mL/min   Anion gap 8 5 - 15  CBC with Differential  Result Value Ref  Range   WBC 9.1 4.0 - 10.5 K/uL   RBC 3.89 3.87 - 5.11 MIL/uL   Hemoglobin 12.2 12.0 - 15.0 g/dL   HCT 16.137.5 09.636.0 - 04.546.0 %   MCV 96.4 78.0 - 100.0 fL   MCH 31.4 26.0 - 34.0 pg   MCHC 32.5 30.0 - 36.0 g/dL   RDW 40.912.0 81.111.5 - 91.415.5 %   Platelets 258 150 - 400 K/uL   Neutrophils Relative % 63 43 - 77 %   Neutro Abs 5.8 1.7 - 7.7 K/uL   Lymphocytes Relative 26 12 - 46 %   Lymphs Abs 2.3 0.7 - 4.0 K/uL   Monocytes Relative 9 3 - 12 %   Monocytes Absolute 0.8 0.1 - 1.0 K/uL   Eosinophils Relative 2 0 - 5 %   Eosinophils Absolute 0.2 0.0 - 0.7 K/uL   Basophils Relative 0 0 - 1 %   Basophils Absolute 0.0 0.0 - 0.1 K/uL  Pregnancy, urine  Result Value Ref Range   Preg Test, Ur POSITIVE (A) NEGATIVE  Urine microscopic-add on  Result Value Ref Range   Squamous Epithelial / LPF FEW (A) RARE   WBC, UA 0-2 <3 WBC/hpf   RBC / HPF 3-6 <3 RBC/hpf   Bacteria, UA FEW (A) RARE   Laboratory interpretation all normal except + pregnancy test.      Imaging Review No results found.   EKG Interpretation None      MDM   Final diagnoses:  LLQ abdominal pain  Pregnancy  Nausea vomiting and diarrhea  Dehydration    Disposition pending per Dr Estell HarpinZammit   Devoria AlbeIva Lennette Fader, MD, Concha PyoFACEP    Gaston Dase, MD 09/08/14 [phone]

## 2014-09-08 NOTE — ED Notes (Signed)
Vomiting and diarrhea, abd pain for a couple of days, states tonight at work her left forearm started felling tingly and numb while she was working

## 2014-09-08 NOTE — Discharge Instructions (Signed)
Follow up with an ob-gyn md for your pregnancy.  Drink plenty of fluids

## 2014-09-08 NOTE — ED Notes (Signed)
Pt awake and sitting up, given soda to drink for fluid challenge. Tolerating well at this time.  Zofran given prior to administration. Pt states she thinks she will be able to give urine sample after she drinks. Pt also asked for pregnancy test, notified EDP.

## 2014-09-18 NOTE — Op Note (Signed)
PATIENT NAMESHAYLIE, [name] MR#:  [id number] DATE OF BIRTH:  August 01, 1990  DATE OF PROCEDURE:  02/09/2012  PREOPERATIVE DIAGNOSIS:  1. Gravida 3, para 1-0-0-1 at 39 weeks, 5 days gestation.  2. Late entry to prenatal care.  3. Nonreassuring fetal surveillance.   POSTOPERATIVE DIAGNOSIS:  1. Gravida 3, para 1-0-0-1 at 39 weeks, 5 days gestation.  2. Late entry to prenatal care.  3. Nonreassuring fetal surveillance.   PROCEDURE PERFORMED:  Primary low transverse cesarean section via Pfannenstiel skin incision.   SURGEON: Florina Ou. Bonney Aid, MD   ANESTHESIA: Spinal.  ESTIMATED BLOOD LOSS: 800 mL.   OPERATIVE FLUIDS: 1400 mL of crystalloid.   URINE OUTPUT: 350 mL of clear urine.   COMPLICATIONS: None.   INDICATION FOR PROCEDURE: The patient is a 24 year old G3, P1-0-1-1 at 39 weeks, 5 days gestation by a 22-weeks, 3-days ultrasound, who presents to labor and delivery with complaints of SROM. Exam revealed her to be intact with no evidence of rupture of membranes. However, during the course of her evaluation on labor and delivery the fetus displayed minimal variability and was never reactive. Following continued monitoring of the fetus, the patient displayed recurrent late decelerations that did not resolve with position changes, supplemental O2 or fluids. The patient was contracting approximately every 5 minutes; however, she displayed no cervical change.   DRAINS OR TUBES: Foley to gravity drainage, On-Q catheter system.   ANTIBIOTICS: 2 grams of Ancef.   SPECIMENS REMOVED: Placenta.   COMPLICATIONS: None.   PATIENT CONDITION FOLLOWING PROCEDURE: Stable.   INTRAOPERATIVE FINDINGS: Normal uterus, tubes, and ovaries. Following delivery of the infant, the placenta spontaneously expelled while obtaining cord blood suggestive of possible abruption. The delivery resulted in the birth of a liveborn female infant weighing 3670 grams, 8 pounds, 1 ounce, Apgars of 8 and 8.   PROCEDURE IN  DETAIL: The risks, benefits, and alternatives of the procedure were discussed with the patient prior to proceeding to the Operating Room. Given the fetal heart rate tracings that were noted and the fact that these contractions were not changing the patient's cervix, I discussed with her my concerns that should we start Pitocin in order to augment her labor the fetus would not be tolerant of this. There was also some concern for possible IUGR given her extremely poor weight gain throughout the course of this pregnancy. The patient did have an extremely short interpregnancy interval of approximately nine months. She was in agreement with proceeding with operative delivery after reviewing the findings of her fetal heart rate tracing with her. The patient was taken to the Operating Room where spinal anesthesia was administered. She was positioned in the supine position, prepped and draped in the usual sterile fashion. Prior to proceeding with the case, the level of anesthetic was checked and noted to be adequate. A Pfannenstiel skin incision was made 2 cm above the pubic symphysis, carried down sharply to the level of the rectus fascia using a knife. The fascia was incised in the midline using the knife and then extended using Mayo scissors. The superior border of the rectus fascia was grasped with two Kocher clamps. The underlying rectus muscles were dissected off the rectus fascia using blunt dissection and the median raphe incised using scissors. The inferior border of the rectus was dissected off the rectus muscle in a similar fashion. The rectus muscles were separated in the midline. The peritoneum was entered bluntly. The peritoneal incision was extended using manual traction. A bladder blade was then  placed. A bladder flap was created using pick-ups and Metzenbaum scissors and developed digitally prior to replacing the bladder blade. A hysterotomy incision was made on the uterus using the knife. The uterus was  entered bluntly using the operator's finger, and the hysterotomy incision was extended using manual traction. Upon placing the operator's hand into the hysterotomy incision, amniotomy occurred, noting clear fluid. The infant was noted to be in the vertex position. The vertex was grasped, flexed, brought to the incision and delivered atraumatically using fundal pressure. The infant was suctioned, the cord was clamped and cut, and the infant was passed to the awaiting pediatrician. Cord blood was obtained. During obtaining the cord blood, the placenta delivered spontaneously and was expelled raising the concern for possible abruption. Given the fact that the patient had fairly poor dating, and the infant was significantly larger than expected, the fetal heart rate tracings could potentially have been attributed to a postterm pregnancy beyond 42 weeks. Following delivery of the placenta, the uterus was exteriorized. The hysterotomy incision was repaired using a two-layer closure of 0 Vicryl with the first being a running locked, the second a vertical imbricating layer. Following closure of the hysterotomy, it was inspected and noted to be hemostatic. The pelvis was irrigated with warm saline. The uterus was replaced into the abdomen, and the peritoneum was closed using a 2-0 Vicryl in a running fashion. The superior border of the rectus fascia was grasped with two Kocher clamps. The On-Q introducers were then placed using the supplied trocars subfascially. The trocars were removed and the On-Q catheters were threaded through the introducers prior to removing the introducers. The rectus muscle was inspected and noted to be hemostatic. The fascia was closed using a looped #1 PDS in a running fashion. Following fascial closure, the subcutaneous tissue was irrigated and hemostasis was achieved using the Bovie. Skin was closed using the Insorb absorbable stapler. Following closure at the skin, the On-Q catheters were  dressed with Dermabond and each On-Q catheter was flushed with 5 mL of 0.5% bupivacaine each. Sponge, needle, and instrument counts were correct x2. The patient tolerated the procedure well and was taken to the recovery room in stable condition.  ____________________________ Florina OuAndreas M. Bonney AidStaebler, MD ams:cbb D: 02/11/2012 18:50:28 ET T: 02/12/2012 11:09:06 ET JOB#: [id number]  cc: Florina OuAndreas M. Bonney AidStaebler, MD, <Dictator> Lorrene ReidANDREAS M Ancil Dewan MD ELECTRONICALLY SIGNED 02/23/2012 21:26

## 2014-10-09 NOTE — H&P (Signed)
L&D Evaluation:  History:   HPI 24 year old G3 P1011 presents from ER with c/o LLQ and upper abdominal pain and LOF after fall on 10/10/11 at 11 PM. States was chasing her dogs when she tripped and landed on her hands and knees. 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 The pain is constant and worsens with movement. Rates pain 6/10. Has heartburn and reflux, but no diarrhea, N/V.  Denies contractions or abdominal tightening. She also had LOF x1 right after her fall. She was seen in ER where she had labs and a ultrasound performed. EGA  [redacted]wk3 days giving her a new EDC=02/11/2012. Normal anatomy on ultrasound and no evidence of abruption with anterior placenta. [facility], but has not had an ultrasound. S/P SVD 6 mos ago.    Presents with abdominal pain, LLQ/upper abd pain and  LOF after fall    Patient's Medical History No Chronic Illness    Patient's Surgical History none    Medications Pre Natal Vitamins    Allergies NKDA    Social History none   ROS:   ROS see HPI   Exam:   Vital Signs stable    General no apparent distress, pleasant, WF, joking with staff    Mental Status clear    Chest clear    Heart normal sinus rhythm    Abdomen gravid, soft. tenderness over left border of uterus.    Edema no edema    Reflexes EXT: abrasions on both palms and knees.    Pelvic greenish discharge at introitus with slightly inflammed vestibule. wet prep positive for  hyphae.    Mebranes Intact    FHT FHTs  in the 120s    Skin abrasions on palms and knees   Impression:   Impression IUP at 22 wks3 days. LLQ pain s/p fall probably due to round ligament strain.  ?etiology of upper abdominal tenderness.  No evidence of abruption. Monilia   Plan:   Plan Tylenol for muscular pain and Diflucan 150 mgm po for monilial infection. Discussed use of heat for ligament/muscular pain.  and po tylenol.  FU with CCHD. Given copy of US results to take to her appt.   Electronic Signatures: Trinna BalloonGutierrez, Laval Cafaro L (CNM)  (Signed 12-May-13 08:49)  Authored: L&D Evaluation   Last Updated: 12-May-13 08:49 by Trinna BalloonGutierrez, Lorita Forinash L (CNM)

## 2014-10-09 NOTE — H&P (Signed)
L&D Evaluation:  History:   HPI 24 year old G3 P1011  8785w5d by 7118w3d US derived EDC of 02/11/2012 presenting wtih complaints of SROM this morning and contractions since 01/29/2012.  Some light spotting and mucous discharge.  + FM.   [facility], s/p SVD 9 mos ago.  Thus far prenatal course noteable for late entry to care. Labs O pos / ABSC neg / RI / VZI / RPR NR / HIV neg / HBsAg neg / GC&CT neg & neg / GBS neg 01/18/2012.    Presents with abdominal pain, LLQ/upper abd pain and  LOF after fall    Patient's Medical History No Chronic Illness    Patient's Surgical History none    Medications Pre Natal Vitamins    Allergies NKDA    Social History none   ROS:   ROS see HPI   Exam:   Vital Signs stable    General no apparent distress    Abdomen gravid, non-tender    Estimated Fetal Weight Small for gestational age    Fetal Position vtx    Edema no edema    Pelvic 2-3/80/-2    Mebranes Intact    FHT normal rate with no decels    Ucx irregular   Impression:   Impression IUP at 4085w5d2 gestation presenting for r/o ROM   Plan:   Comments 1) R/O ROM - negative pooling, ferning.  Given some old blood nitrazine not obtained.  Shortly after exam patient has large gush of fluid consistent with gross ROM.  Will admit for SROM at term, monitor contractions.  Will augment with pitocin if needed.    2) Fetus - category II tracing  3) PNL - GBS negative per records   Electronic Signatures for Addendum Section:  Lorrene ReidStaebler, Rufus Beske M (MD) (Signed Addendum 10-Sep-13 18:07)  Rechecked after episode of gross ROM, still with intact bag palpable on bimanula. No pooling, negative ferning.  Patient possibly urinated on chucks pad.  WIll monitor and recheck in 2-hrs.  Her fetal heart tracing has been category II but non-reactive and I told her she may be regardless of whether or not her cervix makes change   Electronic Signatures: Lorrene ReidStaebler,  Meryle Pugmire M (MD)  (Signed 10-Sep-13 17:27)  Authored: L&D Evaluation   Last Updated: 10-Sep-13 18:07 by Lorrene ReidStaebler, Jobie Popp M (MD)

## 2015-02-07 ENCOUNTER — Encounter (HOSPITAL_COMMUNITY): Payer: Self-pay | Admitting: Emergency Medicine

## 2015-02-07 ENCOUNTER — Emergency Department (HOSPITAL_COMMUNITY)
Admission: EM | Admit: 2015-02-07 | Discharge: 2015-02-07 | Disposition: A | Discharge status: Home / Self Care | Payer: BLUE CROSS/BLUE SHIELD | Attending: Emergency Medicine | Admitting: Emergency Medicine

## 2015-02-07 DIAGNOSIS — Z9889 Other specified postprocedural states: Secondary | ICD-10-CM | POA: Diagnosis not present

## 2015-02-07 DIAGNOSIS — O9989 Other specified diseases and conditions complicating pregnancy, childbirth and the puerperium: Secondary | ICD-10-CM | POA: Diagnosis not present

## 2015-02-07 DIAGNOSIS — Z79899 Other long term (current) drug therapy: Secondary | ICD-10-CM | POA: Diagnosis not present

## 2015-02-07 DIAGNOSIS — R102 Pelvic and perineal pain: Secondary | ICD-10-CM

## 2015-02-07 DIAGNOSIS — Z3A26 26 weeks gestation of pregnancy: Secondary | ICD-10-CM | POA: Insufficient documentation

## 2015-02-07 LAB — URINALYSIS, ROUTINE W REFLEX MICROSCOPIC
BILIRUBIN URINE: NEGATIVE
Glucose, UA: NEGATIVE mg/dL
Nitrite: NEGATIVE
PH: 6.5 (ref 5.0–8.0)
Protein, ur: NEGATIVE mg/dL
Specific Gravity, Urine: <1.005 — ABNORMAL LOW (ref 1.005–1.030)
Urobilinogen, UA: 0.2 mg/dL (ref 0.0–1.0)

## 2015-02-07 LAB — WET PREP, GENITAL
Trich, Wet Prep: NONE SEEN
Yeast Wet Prep HPF POC: NONE SEEN

## 2015-02-07 LAB — URINE MICROSCOPIC-ADD ON

## 2015-02-07 MED ORDER — ACETAMINOPHEN 500 MG PO TABS
1000.0000 mg | ORAL_TABLET | Freq: Once | ORAL | Status: AC
  Administered 2015-02-07: 1000 mg via ORAL
  Filled 2015-02-07: qty 2

## 2015-02-07 NOTE — Discharge Instructions (Signed)

## 2015-02-07 NOTE — Progress Notes (Signed)
Call from [facility] ED about patient G4P2 unsure of dating with no prenatal care c/o contractions and vaginal pain; patient received early U/S and she is 26 and 2/7 weeks at this time; EFM applied and assessing with occasional contractions noted and reactive NST; Dr Emelda Fear made aware of patient status at this time and stated if SVE is closed and EFM reactive patient maybe discharge home

## 2015-02-07 NOTE — ED Provider Notes (Signed)
CSN: [id number]     Arrival date & time 02/07/15  1946 History  This chart was scribed for Eber Hong, MD by Elon Spanner, ED Scribe. This patient was seen in room APA01/APA01 and the patient's care was started at 8:15 PM.   Chief Complaint  Patient presents with  . Vaginal Pain   The history is provided by the patient. No language interpreter was used.    HPI Comments: [name] is a 24 y.o. female who is [redacted] weeks pregnant (not seen by OBGYN yet; appt on 9/14 at [facility]).  She presents to the ED complaining of intermittent, moderate, unchanged, sharp vaginal pain onset 1 week ago.  Preceding the pain, the patient reports an episode of mucousy vaginal discharge that has continued every other day.  Associated symptoms also include relatively less severe abdominal pain and mild, worsening bilateral leg pain (pt stands a lot at work).  She had an abdominal UTS 5 weeks into pregnancy in ED.  Patient denies being currently sexually active or possibility of STD.  She denies any significant medical hx.  Patient denies use of alcohol, tobacco, other drugs.  She denies vaginal bleeding, dysuria.  [id number]  History reviewed. No pertinent past medical history. Past Surgical History  Procedure Laterality Date  . Cesarean section     History reviewed. No pertinent family history. Social History  Substance Use Topics  . Smoking status: Never Smoker   . Smokeless tobacco: None  . Alcohol Use: No   OB History    Gravida Para Term Preterm AB TAB SAB Ectopic Multiple Living   Review of Systems A complete 10 system review of systems was obtained and all systems are negative except as noted in the HPI and PMH.   Allergies  Review of patient's allergies indicates no known allergies.  Home Medications   Prior to Admission medications   Medication Sig Start Date End Date Taking? Authorizing Provider  prenatal vitamin w/FE, FA (PRENATAL 1 + 1) 27-1 MG TABS Take 1 tablet by  mouth every morning.   Yes Historical Provider, MD  acetaminophen (TYLENOL) 500 MG tablet Take 500 mg by mouth every 6 (six) hours as needed. For pain    Historical Provider, MD  acyclovir (ZOVIRAX) 400 MG tablet Take 1 tablet (400 mg total) by mouth 4 (four) times daily. Patient not taking: Reported on 02/07/2015 08/26/12   Annamarie Dawley, MD  azithromycin (ZITHROMAX) 250 MG tablet 1 every day until finished. Patient not taking: Reported on 02/07/2015 05/16/12   Annamarie Dawley, MD  ondansetron (ZOFRAN ODT) 4 MG disintegrating tablet  ODT q4 hours prn nausea/vomit Patient not taking: Reported on 02/07/2015 09/08/14   Bethann Berkshire, MD   BP 100/56 mmHg  Pulse 69  Temp(Src) 98 F (36.7 C) (Oral)  Resp 18  SpO2 99%  LMP 08/01/2014  Breastfeeding? Unknown Physical Exam  Constitutional: She is oriented to person, place, and time. Vital signs are normal. She appears well-developed and well-nourished.  Non-toxic appearance. No distress.  Afebrile, nontoxic, NAD  HENT:  Head: Normocephalic and atraumatic.  Mouth/Throat: Oropharynx is clear and moist and mucous membranes are normal.  Evidence of mild trauma to right periorbital area with mild bruising.    Eyes: Conjunctivae and EOM are normal. Right eye exhibits no discharge. Left eye exhibits no discharge.  Neck: Normal range of motion. Neck supple.  Cardiovascular: Normal rate, regular rhythm,  normal heart sounds and intact distal pulses.  Exam reveals no gallop and no friction rub.   No murmur heard. Heart and lungs normal exams.    Pulmonary/Chest: Effort normal and breath sounds normal. No respiratory distress. She has no decreased breath sounds. She has no wheezes. She has no rhonchi. She has no rales.  Abdominal: Soft. Normal appearance and bowel sounds are normal. She exhibits no distension. There is no tenderness. There is no rigidity, no rebound, no guarding, no CVA tenderness, no tenderness at McBurney's point and negative Murphy's sign.   Uterus above level umbilicus.  Soft abdomen.  No tenderness.  Positive fetal movements.  Bedside UTS confirms HR of 130-135.    Genitourinary:  Pelvic Exam: Chaperon present.  Heavy white frothy discharge.  No tenderness. No CMT.  No adnexal masses.  No blood.  No foul smell.   Musculoskeletal: Normal range of motion.  No edema.    Neurological: She is alert and oriented to person, place, and time. She has normal strength. No sensory deficit.  Skin: Skin is warm, dry and intact. No rash noted.  Psychiatric: She has a normal mood and affect.  Nursing note and vitals reviewed.   ED Course  Procedures (including critical care time)  DIAGNOSTIC STUDIES: Oxygen Saturation is 98% on RA, normal by my interpretation.    COORDINATION OF CARE:  10:24 PM Discussed treatment plan with patient at bedside.  Patient acknowledges and agrees with plan.     Labs Review Labs Reviewed  WET PREP, GENITAL - Abnormal; Notable for the following:    Clue Cells Wet Prep HPF POC FEW (*)    WBC, Wet Prep HPF POC FEW (*)    All other components within normal limits  URINALYSIS, ROUTINE W REFLEX MICROSCOPIC (NOT AT [facility]) - Abnormal; Notable for the following:    APPearance HAZY (*)    Specific Gravity, Urine <1.005 (*)    Hgb urine dipstick TRACE (*)    Ketones, ur TRACE (*)    Leukocytes, UA TRACE (*)    All other components within normal limits  URINE MICROSCOPIC-ADD ON - Abnormal; Notable for the following:    Squamous Epithelial / LPF MANY (*)    Bacteria, UA FEW (*)    All other components within normal limits  GC/CHLAMYDIA PROBE AMP ([place]) NOT AT [facility]    Imaging Review No results found. I have personally reviewed and evaluated these images and lab results as part of my medical decision-making.    MDM   Final diagnoses:  Vaginal pain    D/w Dr. Emelda Fear - he will see pt in the AM if needed - she will call in the AM to let them know how she is doing.  q 15 minutes small  contraction - Dr. Emelda Fear will see tomorrow as needed - encouraged to return if symptoms worsen - contractions come closer - pt in agreement.  UA and wet prep overall unremarkable.  Pt expresses understanding.   I personally performed the services described in this documentation, which was scribed in my presence. The recorded information has been reviewed and is accurate.      Eber Hong, MD 02/07/15 2224

## 2015-02-07 NOTE — ED Notes (Signed)
Patient states she believes she is [redacted] weeks pregnant, but is unsure. States she has an appointment with OBGYN soon. Complaining of intermittent sharp pain to vaginal area. Also reports intermittent abdominal pain. States she passed some mucusy discharge approximately a week ago and has been having vaginal pains ever since that have been getting increasingly worse. This is her 4th pregnancy, patient has delivered twice.

## 2015-02-08 ENCOUNTER — Telehealth: Payer: Self-pay | Admitting: *Deleted

## 2015-02-11 LAB — GC/CHLAMYDIA PROBE AMP (~~LOC~~) NOT AT ARMC
Chlamydia: NEGATIVE
NEISSERIA GONORRHEA: NEGATIVE

## 2015-02-12 ENCOUNTER — Other Ambulatory Visit: Payer: Self-pay | Admitting: Obstetrics & Gynecology

## 2015-02-12 DIAGNOSIS — Z1389 Encounter for screening for other disorder: Secondary | ICD-10-CM

## 2015-02-13 ENCOUNTER — Ambulatory Visit (INDEPENDENT_AMBULATORY_CARE_PROVIDER_SITE_OTHER): Payer: BLUE CROSS/BLUE SHIELD

## 2015-02-13 DIAGNOSIS — Z36 Encounter for antenatal screening of mother: Secondary | ICD-10-CM

## 2015-02-13 DIAGNOSIS — Z1389 Encounter for screening for other disorder: Secondary | ICD-10-CM

## 2015-02-13 NOTE — Progress Notes (Signed)
Dating/Anatomy scan today 26+6wks, svp of fluid 7.4cm,efw 1039g,fht 126bpm,post pl gr 0,cx 3.8, bilat adnexa wnl,cephalic,anatomy complete no obvious abn seen

## 2015-02-20 ENCOUNTER — Encounter: Payer: BLUE CROSS/BLUE SHIELD | Admitting: Women's Health

## 2015-02-20 ENCOUNTER — Encounter: Payer: Self-pay | Admitting: Women's Health

## 2015-03-18 ENCOUNTER — Inpatient Hospital Stay
Admission: EM | Admit: 2015-03-18 | Discharge: 2015-03-19 | Disposition: A | Discharge status: Home / Self Care | Payer: BLUE CROSS/BLUE SHIELD | Attending: Obstetrics and Gynecology | Admitting: Obstetrics and Gynecology

## 2015-03-18 DIAGNOSIS — Z3A32 32 weeks gestation of pregnancy: Secondary | ICD-10-CM | POA: Insufficient documentation

## 2015-03-18 DIAGNOSIS — O42913 Preterm premature rupture of membranes, unspecified as to length of time between rupture and onset of labor, third trimester: Secondary | ICD-10-CM | POA: Insufficient documentation

## 2015-03-18 NOTE — Progress Notes (Signed)
Perineum is dry in appearance.   Nitrazine is Negative. Cervix is closed.

## 2015-03-18 NOTE — OB Triage Note (Signed)
Pt to L&D with c/o of ?ROM, has been le[place] since 1600 today.  No VB   +FM

## 2015-03-19 DIAGNOSIS — O42913 Preterm premature rupture of membranes, unspecified as to length of time between rupture and onset of labor, third trimester: Secondary | ICD-10-CM | POA: Diagnosis not present

## 2015-03-19 DIAGNOSIS — Z3A32 32 weeks gestation of pregnancy: Secondary | ICD-10-CM | POA: Diagnosis not present

## 2015-03-19 NOTE — Progress Notes (Signed)
Report given to Dr.. Orders received to d/c home.

## 2015-03-19 NOTE — Progress Notes (Signed)
Pt given d/c inst. And verbalized understanding.  Pt was then d/c home ambulatory in stable condition.

## 2015-04-14 ENCOUNTER — Encounter (HOSPITAL_COMMUNITY): Payer: Self-pay | Admitting: Cardiology

## 2015-04-14 ENCOUNTER — Emergency Department (HOSPITAL_COMMUNITY): Payer: BLUE CROSS/BLUE SHIELD

## 2015-04-14 ENCOUNTER — Emergency Department (HOSPITAL_COMMUNITY)
Admission: EM | Admit: 2015-04-14 | Discharge: 2015-04-14 | Disposition: A | Discharge status: Home / Self Care | Payer: BLUE CROSS/BLUE SHIELD | Attending: Emergency Medicine | Admitting: Emergency Medicine

## 2015-04-14 DIAGNOSIS — Y9389 Activity, other specified: Secondary | ICD-10-CM | POA: Diagnosis not present

## 2015-04-14 DIAGNOSIS — Z79899 Other long term (current) drug therapy: Secondary | ICD-10-CM | POA: Diagnosis not present

## 2015-04-14 DIAGNOSIS — S60221A Contusion of right hand, initial encounter: Secondary | ICD-10-CM | POA: Insufficient documentation

## 2015-04-14 DIAGNOSIS — W1839XA Other fall on same level, initial encounter: Secondary | ICD-10-CM | POA: Insufficient documentation

## 2015-04-14 DIAGNOSIS — S6991XA Unspecified injury of right wrist, hand and finger(s), initial encounter: Secondary | ICD-10-CM | POA: Diagnosis present

## 2015-04-14 DIAGNOSIS — Y9289 Other specified places as the place of occurrence of the external cause: Secondary | ICD-10-CM | POA: Insufficient documentation

## 2015-04-14 DIAGNOSIS — Y998 Other external cause status: Secondary | ICD-10-CM | POA: Insufficient documentation

## 2015-04-14 DIAGNOSIS — S80211A Abrasion, right knee, initial encounter: Secondary | ICD-10-CM | POA: Insufficient documentation

## 2015-04-14 MED ORDER — ACETAMINOPHEN 325 MG PO TABS
650.0000 mg | ORAL_TABLET | Freq: Once | ORAL | Status: AC
  Administered 2015-04-14: 650 mg via ORAL
  Filled 2015-04-14: qty 2

## 2015-04-14 NOTE — ED Notes (Addendum)
Fell yesterday and injured right hand.

## 2015-04-14 NOTE — Discharge Instructions (Signed)
Your x-ray is negative for fracture or dislocation. Please use the Ace wrap over the next 5 days. Please apply ice, and keep her hand elevated above your heart. May use Tylenol for soreness. Hand Contusion A hand contusion is a deep bruise on your hand area. Contusions are the result of an injury that caused bleeding under the skin. The contusion may turn blue, purple, or yellow. Minor injuries will give you a painless contusion, but more severe contusions may stay painful and swollen for a few weeks. CAUSES  A contusion is usually caused by a blow, trauma, or direct force to an area of the body. SYMPTOMS   Swelling and redness of the injured area.  Discoloration of the injured area.  Tenderness and soreness of the injured area.  Pain. DIAGNOSIS  The diagnosis can be made by taking a history and performing a physical exam. An X-ray, CT scan, or MRI may be needed to determine if there were any associated injuries, such as broken bones (fractures). TREATMENT  Often, the best treatment for a hand contusion is resting, elevating, icing, and applying cold compresses to the injured area. Over-the-counter medicines may also be recommended for pain control. HOME CARE INSTRUCTIONS   Put ice on the injured area.  Put ice in a plastic bag.  Place a towel between your skin and the bag.  Leave the ice on for 15-20 minutes, 03-04 times a day.  Only take over-the-counter or prescription medicines as directed by your caregiver. Your caregiver may recommend avoiding anti-inflammatory medicines (aspirin, ibuprofen, and naproxen) for 48 hours because these medicines may increase bruising.  If told, use an elastic wrap as directed. This can help reduce swelling. You may remove the wrap for sleeping, showering, and bathing. If your fingers become numb, cold, or blue, take the wrap off and reapply it more loosely.  Elevate your hand with pillows to reduce swelling.  Avoid overusing your hand if it is  painful. SEEK IMMEDIATE MEDICAL CARE IF:   You have increased redness, swelling, or pain in your hand.  Your swelling or pain is not relieved with medicines.  You have loss of feeling in your hand or are unable to move your fingers.  Your hand turns cold or blue.  You have pain when you move your fingers.  Your hand becomes warm to the touch.  Your contusion does not improve in 2 days. MAKE SURE YOU:   Understand these instructions.  Will watch your condition.  Will get help right away if you are not doing well or get worse.   This information is not intended to replace advice given to you by your health care provider. Make sure you discuss any questions you have with your health care provider.   Document Released: 11/07/2001 Document Revised: 02/10/2012 Document Reviewed: 11/09/2011 Elsevier Interactive Patient Education [employer]2016 Elsevier Inc.

## 2015-04-14 NOTE — ED Provider Notes (Signed)
History  By signing my name below, I, Karle PlumberJennifer Tensley, attest that this documentation has been prepared under the direction and in the presence of Ivery QualeHobson Darolyn Double, PA-C. Electronically Signed: Karle PlumberJennifer Tensley, ED Scribe. 04/14/2015. 5:29 PM.  Chief Complaint  Patient presents with  . Hand Injury   The history is provided by the patient and medical records. No language interpreter was used.    HPI Comments:  [name] is a 24 y.o. female at 7735 weeks gestation who presents to the Emergency Department complaining of a right hand injury that she sustained yesterday when she fell and landed on the back of the hand. She reports some mild swelling and moderate throbbing pain. Pt also reports an abrasion to her right knee. She has not done anything to treat the injury. She denies modifying factors. She denies fever, chills, nausea, vomiting, LOC, head injury, wounds, numbness, tingling or weakness of the right hand or RUE. She denies any vaginal bleeding or abdominal cramping since the fall.  History reviewed. No pertinent past medical history. Past Surgical History  Procedure Laterality Date  . Cesarean section    . Cesarean section N/A    History reviewed. No pertinent family history. Social History  Substance Use Topics  . Smoking status: Never Smoker   . Smokeless tobacco: None  . Alcohol Use: No   OB History    Gravida Para Term Preterm AB TAB SAB Ectopic Multiple Living   4 2   1  1  1 3      Review of Systems  Constitutional: Negative for fever and chills.  Gastrointestinal: Negative for nausea, vomiting and abdominal pain.  Genitourinary: Negative for vaginal bleeding.  Musculoskeletal: Positive for arthralgias.  Skin: Positive for color change. Negative for wound.  Neurological: Negative for syncope, weakness and numbness.    Allergies  Review of patient's allergies indicates no known allergies.  Home Medications   Prior to Admission medications   Medication Sig  Start Date End Date Taking? Authorizing Provider  prenatal vitamin w/FE, FA (PRENATAL 1 + 1) 27-1 MG TABS Take 1 tablet by mouth every morning.   Yes Historical Provider, MD  ondansetron (ZOFRAN ODT) 4 MG disintegrating tablet 4mg  ODT q4 hours prn nausea/vomit Patient not taking: Reported on 02/07/2015 09/08/14   Bethann BerkshireJoseph Zammit, MD   Triage Vitals: BP 125/60 mmHg  Pulse 87  Temp(Src) 98.4 F (36.9 C) (Oral)  Resp 16  Ht 4\' 11"  (1.499 m)  Wt 130 lb (58.968 kg)  BMI 26.24 kg/m2  SpO2 98%  LMP 08/01/2014 Physical Exam  Constitutional: She is oriented to person, place, and time. She appears well-developed and well-nourished.  HENT:  Head: Normocephalic and atraumatic.  Eyes: EOM are normal.  Neck: Normal range of motion.  Cardiovascular: Normal rate.   Cap refill less than 2 seconds. Right radial pulse is 2+.  Pulmonary/Chest: Effort normal.  Musculoskeletal: Normal range of motion. She exhibits edema and tenderness.  No swelling or deformity of right shoulder or right elbow. Swelling of dorsum of right hand. Bruise to palmar surface of right hand. Stiffness of right wrist, but good ROM. Abrasion of right knee. Full ROM of right knee and ankle.  Neurological: She is alert and oriented to person, place, and time.  Skin: Skin is warm and dry.  Psychiatric: She has a normal mood and affect. Her behavior is normal.  Nursing note and vitals reviewed.   ED Course  Pt denies any injury to the abd during the fall. No  vaginal bleeding. No difficulty with breathing. No blood in urine reported.  Procedures (including critical care time) DIAGNOSTIC STUDIES: Oxygen Saturation is 98% on RA, normal by my interpretation.   COORDINATION OF CARE: 5:14 PM- Will order ACE wrap and advised pt to RICE right hand and take Tylenol for pain as needed. Pt verbalizes understanding and agrees to plan.  Medications - No data to display  Labs Review Labs Reviewed - No data to display  Imaging Review Dg Hand  Complete Right  04/14/2015  CLINICAL DATA:  Fall on outstretched hand, soft tissue swelling. EXAM: RIGHT HAND - COMPLETE 3+ VIEW COMPARISON:  None. FINDINGS: Three views of the right hand are provided. Osseous alignment is normal. Bone mineralization is normal. There is no fracture line or displaced fracture fragment identified. No focal soft tissue abnormality. IMPRESSION: Negative. Electronically Signed   By: Bary Richard M.D.   On: 04/14/2015 16:38   I have personally reviewed and evaluated these images and lab results as part of my medical decision-making.   EKG Interpretation None      MDM  Xray of the right hand is negative for fracture or dislocation. Pt  No shoulder, clavical, or elbow involvement. Pt fitted with ACE wrap and given ice pack. She will use tylenol for soreness.   Final diagnoses:  None    **I have reviewed nursing notes, vital signs, and all appropriate lab and imaging results for this patient.*  I personally performed the services described in this documentation, which was scribed in my presence. The recorded information has been reviewed and is accurate.    Ivery Quale, PA-C 04/14/15 1741  Linwood Dibbles, MD 04/14/15 [phone]

## 2015-04-18 ENCOUNTER — Inpatient Hospital Stay: Payer: BLUE CROSS/BLUE SHIELD | Admitting: Certified Registered Nurse Anesthetist

## 2015-04-18 ENCOUNTER — Encounter
Admission: EM | Disposition: A | Discharge status: Home / Self Care | Payer: Self-pay | Source: Home / Self Care | Attending: Obstetrics and Gynecology

## 2015-04-18 ENCOUNTER — Inpatient Hospital Stay
Admission: EM | Admit: 2015-04-18 | Discharge: 2015-04-21 | DRG: 766 | Disposition: A | Discharge status: Home / Self Care | Payer: BLUE CROSS/BLUE SHIELD | Attending: Obstetrics and Gynecology | Admitting: Obstetrics and Gynecology

## 2015-04-18 DIAGNOSIS — O34211 Maternal care for low transverse scar from previous cesarean delivery: Secondary | ICD-10-CM | POA: Diagnosis present

## 2015-04-18 DIAGNOSIS — Z3A36 36 weeks gestation of pregnancy: Secondary | ICD-10-CM

## 2015-04-18 DIAGNOSIS — Z9849 Cataract extraction status, unspecified eye: Secondary | ICD-10-CM

## 2015-04-18 DIAGNOSIS — O479 False labor, unspecified: Secondary | ICD-10-CM | POA: Diagnosis present

## 2015-04-18 DIAGNOSIS — Z48810 Encounter for surgical aftercare following surgery on the sense organs: Secondary | ICD-10-CM

## 2015-04-18 LAB — COMPREHENSIVE METABOLIC PANEL
ALBUMIN: 2.8 g/dL — AB (ref 3.5–5.0)
ALT: 11 U/L — ABNORMAL LOW (ref 14–54)
ANION GAP: 8 (ref 5–15)
AST: 20 U/L (ref 15–41)
Alkaline Phosphatase: 217 U/L — ABNORMAL HIGH (ref 38–126)
BUN: 6 mg/dL (ref 6–20)
CHLORIDE: 109 mmol/L (ref 101–111)
CO2: 21 mmol/L — AB (ref 22–32)
Calcium: 8.2 mg/dL — ABNORMAL LOW (ref 8.9–10.3)
Creatinine, Ser: 0.45 mg/dL (ref 0.44–1.00)
GFR calc non Af Amer: >60 mL/min (ref >60)
GLUCOSE: 68 mg/dL (ref 65–99)
POTASSIUM: 3.2 mmol/L — AB (ref 3.5–5.1)
SODIUM: 138 mmol/L (ref 135–145)
Total Bilirubin: 1.8 mg/dL — ABNORMAL HIGH (ref 0.3–1.2)
Total Protein: 6.3 g/dL — ABNORMAL LOW (ref 6.5–8.1)

## 2015-04-18 LAB — ROM PLUS (ARMC ONLY): ROM PLUS: NEGATIVE

## 2015-04-18 LAB — CBC WITH DIFFERENTIAL/PLATELET
BASOS ABS: 0 10*3/uL (ref 0–0.1)
BASOS PCT: 0 %
EOS ABS: 0.1 10*3/uL (ref 0–0.7)
Eosinophils Relative: 1 %
HCT: 33.3 % — ABNORMAL LOW (ref 35.0–47.0)
HEMOGLOBIN: 11.5 g/dL — AB (ref 12.0–16.0)
LYMPHS ABS: 1.2 10*3/uL (ref 1.0–3.6)
Lymphocytes Relative: 20 %
MCH: 32.7 pg (ref 26.0–34.0)
MCHC: 34.5 g/dL (ref 32.0–36.0)
MCV: 94.8 fL (ref 80.0–100.0)
Monocytes Absolute: 0.5 10*3/uL (ref 0.2–0.9)
Monocytes Relative: 8 %
NEUTROS PCT: 71 %
Neutro Abs: 4.1 10*3/uL (ref 1.4–6.5)
Platelets: 209 10*3/uL (ref 150–440)
RBC: 3.52 MIL/uL — AB (ref 3.80–5.20)
RDW: 12.2 % (ref 11.5–14.5)
WBC: 5.8 10*3/uL (ref 3.6–11.0)

## 2015-04-18 LAB — URINE DRUG SCREEN, QUALITATIVE (ARMC ONLY)
Amphetamines, Ur Screen: NOT DETECTED
Barbiturates, Ur Screen: NOT DETECTED
Benzodiazepine, Ur Scrn: NOT DETECTED
CANNABINOID 50 NG, UR ~~LOC~~: NOT DETECTED
COCAINE METABOLITE, UR ~~LOC~~: NOT DETECTED
MDMA (Ecstasy)Ur Screen: NOT DETECTED
METHADONE SCREEN, URINE: NOT DETECTED
Opiate, Ur Screen: NOT DETECTED
Phencyclidine (PCP) Ur S: NOT DETECTED
TRICYCLIC, UR SCREEN: NOT DETECTED

## 2015-04-18 LAB — URINALYSIS COMPLETE WITH MICROSCOPIC (ARMC ONLY)
Bilirubin Urine: NEGATIVE
Glucose, UA: NEGATIVE mg/dL
Hgb urine dipstick: NEGATIVE
Leukocytes, UA: NEGATIVE
Nitrite: NEGATIVE
PH: 6 (ref 5.0–8.0)
PROTEIN: 30 mg/dL — AB
Specific Gravity, Urine: 1.017 (ref 1.005–1.030)

## 2015-04-18 LAB — RAPID HIV SCREEN (HIV 1/2 AB+AG)
HIV 1/2 Antibodies: NONREACTIVE
HIV-1 P24 Antigen - HIV24: NONREACTIVE

## 2015-04-18 LAB — WET PREP, GENITAL
CLUE CELLS WET PREP: NONE SEEN
Sperm: NONE SEEN
TRICH WET PREP: NONE SEEN
YEAST WET PREP: NONE SEEN

## 2015-04-18 LAB — LACTATE DEHYDROGENASE: LDH: 184 U/L (ref 98–192)

## 2015-04-18 LAB — CHLAMYDIA/NGC RT PCR (ARMC ONLY)
Chlamydia Tr: NOT DETECTED
N gonorrhoeae: NOT DETECTED

## 2015-04-18 LAB — TYPE AND SCREEN
ABO/RH(D): O POS
Antibody Screen: NEGATIVE

## 2015-04-18 SURGERY — Surgical Case
Anesthesia: Spinal

## 2015-04-18 MED ORDER — PRENATAL MULTIVITAMIN CH
1.0000 | ORAL_TABLET | Freq: Every day | ORAL | Status: DC
  Administered 2015-04-19 – 2015-04-21 (×3): 1 via ORAL
  Filled 2015-04-18 (×3): qty 1

## 2015-04-18 MED ORDER — ZOLPIDEM TARTRATE 5 MG PO TABS: 5.0000 mg | ORAL_TABLET | Freq: Every evening | ORAL | Status: DC | PRN

## 2015-04-18 MED ORDER — SODIUM CHLORIDE 0.9 % IJ SOLN: 3.0000 mL | INTRAMUSCULAR | Status: DC | PRN

## 2015-04-18 MED ORDER — NALBUPHINE HCL 10 MG/ML IJ SOLN: 5.0000 mg | Freq: Once | INTRAMUSCULAR | Status: DC | PRN

## 2015-04-18 MED ORDER — ACETAMINOPHEN 325 MG PO TABS
650.0000 mg | ORAL_TABLET | ORAL | Status: DC | PRN
  Administered 2015-04-19 – 2015-04-20 (×2): 650 mg via ORAL
  Filled 2015-04-18 (×2): qty 2

## 2015-04-18 MED ORDER — BUPIVACAINE 0.25 % ON-Q PUMP DUAL CATH 400 ML
INJECTION | Status: AC
  Filled 2015-04-18: qty 400

## 2015-04-18 MED ORDER — MENTHOL 3 MG MT LOZG
1.0000 | LOZENGE | OROMUCOSAL | Status: DC | PRN
Start: 2015-04-18 — End: 2015-04-21

## 2015-04-18 MED ORDER — LACTATED RINGERS IV SOLN
INTRAVENOUS | Status: DC
Start: 2015-04-18 — End: 2015-04-21

## 2015-04-18 MED ORDER — DIPHENHYDRAMINE HCL 50 MG/ML IJ SOLN: 12.5000 mg | INTRAMUSCULAR | Status: DC | PRN

## 2015-04-18 MED ORDER — SIMETHICONE 80 MG PO CHEW
80.0000 mg | CHEWABLE_TABLET | Freq: Three times a day (TID) | ORAL | Status: DC
  Administered 2015-04-19 – 2015-04-21 (×8): 80 mg via ORAL
  Filled 2015-04-18 (×9): qty 1

## 2015-04-18 MED ORDER — SCOPOLAMINE 1 MG/3DAYS TD PT72
1.0000 | MEDICATED_PATCH | Freq: Once | TRANSDERMAL | Status: DC
  Administered 2015-04-19: 1.5 mg via TRANSDERMAL
  Filled 2015-04-18: qty 1

## 2015-04-18 MED ORDER — TETANUS-DIPHTH-ACELL PERTUSSIS 5-2.5-18.5 LF-MCG/0.5 IM SUSP
0.5000 mL | Freq: Once | INTRAMUSCULAR | Status: AC
  Administered 2015-04-21: 0.5 mL via INTRAMUSCULAR
  Filled 2015-04-18: qty 0.5

## 2015-04-18 MED ORDER — NALBUPHINE HCL 10 MG/ML IJ SOLN: 5.0000 mg | INTRAMUSCULAR | Status: DC | PRN

## 2015-04-18 MED ORDER — WITCH HAZEL-GLYCERIN EX PADS: 1.0000 "application " | MEDICATED_PAD | CUTANEOUS | Status: DC | PRN

## 2015-04-18 MED ORDER — NALOXONE HCL 2 MG/2ML IJ SOSY: 1.0000 ug/kg/h | PREFILLED_SYRINGE | INTRAVENOUS | Status: DC | PRN

## 2015-04-18 MED ORDER — ONDANSETRON HCL 4 MG/2ML IJ SOLN
4.0000 mg | Freq: Three times a day (TID) | INTRAMUSCULAR | Status: DC | PRN
Start: 2015-04-18 — End: 2015-04-21

## 2015-04-18 MED ORDER — CITRIC ACID-SODIUM CITRATE 334-500 MG/5ML PO SOLN
30.0000 mL | ORAL | Status: AC
  Administered 2015-04-18: 30 mL via ORAL
  Filled 2015-04-18: qty 30

## 2015-04-18 MED ORDER — PHENYLEPHRINE HCL 10 MG/ML IJ SOLN
INTRAMUSCULAR | Status: DC | PRN
  Administered 2015-04-18: 100 ug via INTRAVENOUS
  Administered 2015-04-18 (×3): 50 ug via INTRAVENOUS
  Administered 2015-04-18 (×2): 100 ug via INTRAVENOUS

## 2015-04-18 MED ORDER — MORPHINE SULFATE (PF) 0.5 MG/ML IJ SOLN
INTRAMUSCULAR | Status: DC | PRN
  Administered 2015-04-18: .2 mg via EPIDURAL

## 2015-04-18 MED ORDER — DIPHENHYDRAMINE HCL 25 MG PO CAPS: 25.0000 mg | ORAL_CAPSULE | ORAL | Status: DC | PRN

## 2015-04-18 MED ORDER — BUPIVACAINE HCL (PF) 0.5 % IJ SOLN
INTRAMUSCULAR | Status: AC
  Filled 2015-04-18: qty 30

## 2015-04-18 MED ORDER — OXYTOCIN 40 UNITS IN LACTATED RINGERS INFUSION - SIMPLE MED
INTRAVENOUS | Status: DC | PRN
  Administered 2015-04-18: 1000 mL via INTRAVENOUS

## 2015-04-18 MED ORDER — BUPIVACAINE HCL 0.5 % IJ SOLN
INTRAMUSCULAR | Status: DC | PRN
  Administered 2015-04-18: 10 mL

## 2015-04-18 MED ORDER — BUPIVACAINE 0.25 % ON-Q PUMP SINGLE CATH 400 ML
400.0000 mL | INJECTION | Status: DC
  Filled 2015-04-18: qty 400

## 2015-04-18 MED ORDER — FENTANYL CITRATE (PF) 100 MCG/2ML IJ SOLN: 25.0000 ug | INTRAMUSCULAR | Status: DC | PRN

## 2015-04-18 MED ORDER — OXYTOCIN 40 UNITS IN LACTATED RINGERS INFUSION - SIMPLE MED
62.5000 mL/h | INTRAVENOUS | Status: AC
  Administered 2015-04-18: 62.5 mL/h via INTRAVENOUS
  Filled 2015-04-18: qty 1000

## 2015-04-18 MED ORDER — SIMETHICONE 80 MG PO CHEW
80.0000 mg | CHEWABLE_TABLET | ORAL | Status: DC
  Administered 2015-04-18 – 2015-04-21 (×3): 80 mg via ORAL
  Filled 2015-04-18 (×2): qty 1

## 2015-04-18 MED ORDER — NALOXONE HCL 0.4 MG/ML IJ SOLN: 0.4000 mg | INTRAMUSCULAR | Status: DC | PRN

## 2015-04-18 MED ORDER — CEFAZOLIN SODIUM-DEXTROSE 2-3 GM-% IV SOLR
2.0000 g | Freq: Three times a day (TID) | INTRAVENOUS | Status: DC
  Administered 2015-04-18: 2 g via INTRAVENOUS
  Filled 2015-04-18 (×3): qty 50

## 2015-04-18 MED ORDER — LACTATED RINGERS IV SOLN
INTRAVENOUS | Status: DC
  Administered 2015-04-18 (×2): via INTRAVENOUS

## 2015-04-18 MED ORDER — DIPHENHYDRAMINE HCL 25 MG PO CAPS: 25.0000 mg | ORAL_CAPSULE | Freq: Four times a day (QID) | ORAL | Status: DC | PRN

## 2015-04-18 MED ORDER — ONDANSETRON HCL 4 MG/2ML IJ SOLN
INTRAMUSCULAR | Status: AC
  Administered 2015-04-18: 4 mg via INTRAVENOUS
  Filled 2015-04-18: qty 2

## 2015-04-18 MED ORDER — MEPERIDINE HCL 25 MG/ML IJ SOLN: 6.2500 mg | INTRAMUSCULAR | Status: DC | PRN

## 2015-04-18 MED ORDER — ONDANSETRON HCL 4 MG/2ML IJ SOLN
4.0000 mg | Freq: Once | INTRAMUSCULAR | Status: AC | PRN
  Administered 2015-04-18: 4 mg via INTRAVENOUS

## 2015-04-18 MED ORDER — IBUPROFEN 600 MG PO TABS
600.0000 mg | ORAL_TABLET | Freq: Four times a day (QID) | ORAL | Status: DC
  Administered 2015-04-18 – 2015-04-21 (×10): 600 mg via ORAL
  Filled 2015-04-18 (×11): qty 1

## 2015-04-18 MED ORDER — SIMETHICONE 80 MG PO CHEW: 80.0000 mg | CHEWABLE_TABLET | ORAL | Status: DC | PRN

## 2015-04-18 MED ORDER — IBUPROFEN 600 MG PO TABS: 600.0000 mg | ORAL_TABLET | Freq: Four times a day (QID) | ORAL | Status: DC | PRN

## 2015-04-18 MED ORDER — BUPIVACAINE IN DEXTROSE 0.75-8.25 % IT SOLN
INTRATHECAL | Status: DC | PRN
Start: 2015-04-18 — End: 2015-04-18
  Administered 2015-04-18: 1.5 mL via INTRATHECAL

## 2015-04-18 MED ORDER — LANOLIN HYDROUS EX OINT: 1.0000 "application " | TOPICAL_OINTMENT | CUTANEOUS | Status: DC | PRN

## 2015-04-18 MED ORDER — CITRIC ACID-SODIUM CITRATE 334-500 MG/5ML PO SOLN
ORAL | Status: AC
  Administered 2015-04-18: 30 mL via ORAL
  Filled 2015-04-18: qty 15

## 2015-04-18 MED ORDER — DIBUCAINE 1 % RE OINT
1.0000 | TOPICAL_OINTMENT | RECTAL | Status: DC | PRN
Start: 2015-04-18 — End: 2015-04-21

## 2015-04-18 MED ORDER — OXYCODONE-ACETAMINOPHEN 5-325 MG PO TABS
2.0000 | ORAL_TABLET | ORAL | Status: DC | PRN
  Administered 2015-04-20 – 2015-04-21 (×3): 1 via ORAL
  Filled 2015-04-18 (×2): qty 1
  Filled 2015-04-18: qty 2

## 2015-04-18 MED ORDER — CEFAZOLIN SODIUM-DEXTROSE 2-3 GM-% IV SOLR
INTRAVENOUS | Status: AC
  Administered 2015-04-18: 2 g via INTRAVENOUS
  Filled 2015-04-18: qty 50

## 2015-04-18 MED ORDER — SENNOSIDES-DOCUSATE SODIUM 8.6-50 MG PO TABS: 2.0000 | ORAL_TABLET | ORAL | Status: DC

## 2015-04-18 SURGICAL SUPPLY — 21 items
BARRIER ADHS 3X4 INTERCEED (GAUZE/BANDAGES/DRESSINGS) IMPLANT
CANISTER SUCT 3000ML (MISCELLANEOUS) ×3 IMPLANT
CATH KIT ON-Q SILVERSOAK 5IN (CATHETERS) ×6 IMPLANT
CHLORAPREP W/TINT 26ML (MISCELLANEOUS) ×3 IMPLANT
DRSG TELFA 3X8 NADH (GAUZE/BANDAGES/DRESSINGS) ×3 IMPLANT
ELECT CAUTERY BLADE 6.4 (BLADE) ×3 IMPLANT
GAUZE SPONGE 4X4 12PLY STRL (GAUZE/BANDAGES/DRESSINGS) ×3 IMPLANT
GLOVE BIO SURGEON STRL SZ8 (GLOVE) ×15 IMPLANT
GOWN STRL REUS W/ TWL LRG LVL3 (GOWN DISPOSABLE) ×2 IMPLANT
GOWN STRL REUS W/ TWL XL LVL3 (GOWN DISPOSABLE) ×1 IMPLANT
GOWN STRL REUS W/TWL LRG LVL3 (GOWN DISPOSABLE) ×4
GOWN STRL REUS W/TWL XL LVL3 (GOWN DISPOSABLE) ×2
NS IRRIG 1000ML POUR BTL (IV SOLUTION) ×3 IMPLANT
PACK C SECTION AR (MISCELLANEOUS) ×3 IMPLANT
PAD GROUND ADULT SPLIT (MISCELLANEOUS) ×3 IMPLANT
PAD OB MATERNITY 4.3X12.25 (PERSONAL CARE ITEMS) ×3 IMPLANT
PAD PREP 24X41 OB/GYN DISP (PERSONAL CARE ITEMS) ×3 IMPLANT
STRAP SAFETY BODY (MISCELLANEOUS) ×3 IMPLANT
SUT CHROMIC 1 CTX 36 (SUTURE) ×9 IMPLANT
SUT PLAIN GUT 0 (SUTURE) IMPLANT
SUT VIC AB 0 CT1 36 (SUTURE) ×6 IMPLANT
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
## 2015-04-18 NOTE — OB Triage Note (Addendum)
[name] is a 24 yo, G4P2012 at 36.[redacted] weeks gestation reporting to triage for vaginal discharge and contractions.  She reports losing her mucus plug (a reddish, pink blob looking like snot) on yesterday, but is not currently bleeding.  This morning the patient was experiencing a dull lower abdominal pain, rating 9/10.  The pain went away after about 15 minutes of sitting with her feet up.  Two days ago the patient fell on her abdomen and was seen in the emergency department. At time of admission, she denies pain, recent LOF, H/A, Visual disturbances.  She sometimes feels dizzy and has swelling in her feet.

## 2015-04-18 NOTE — Progress Notes (Signed)
Patient ID: Lacey CotaHollie S Spraker, female   DOB: [birth year], 24 y.o.   MRN: [id number] Brief op note  Preop dx: 36+2 weeks with active labor , prior c/s  Postop : same  Procedure : repeat LTCS , on q pump  Anesthesia ; Spinal IIOF: 750 cc EBL : 500 cc O complication  Finding vigorus female APGArs 8/9  2730 gms

## 2015-04-18 NOTE — Anesthesia Procedure Notes (Signed)
Spinal Patient location during procedure: OR Start time: 04/18/2015 8:48 PM End time: 04/18/2015 8:56 PM Staffing Performed by: anesthesiologist  Preanesthetic Checklist Completed: patient identified, site marked, surgical consent, pre-op evaluation, timeout performed, IV checked, risks and benefits discussed and monitors and equipment checked Spinal Block Patient position: sitting Prep: Betadine Patient monitoring: heart rate, continuous pulse ox, blood pressure and cardiac monitor Approach: midline Location: L4-5 Injection technique: single-shot Needle Needle type: Whitacre and Introducer  Needle gauge: 24 G Needle length: 9 cm Needle insertion depth: 7 cm Additional Notes Negative paresthesia. Negative blood return. Positive free-flowing CSF. Expiration date of kit checked and confirmed. Patient tolerated procedure well, without complications.

## 2015-04-18 NOTE — H&P (Signed)
[name] is a 24 y.o. female presenting for ctx and possible LOF . Pt at 36+2 weeks based on  U/S 26 week u/s . Pt with poor prenatal care  And has [facility] .  Prior c/s at 39 weeks resulting from a placental abruption. Pt has admitted to falling several times and states she fell on abd 2 days ago  . Denies bleeding . CTX q1-3 min pain 7+/10 . Declines BTL . Wants an On Q pump   History OB History    Gravida Para Term Preterm AB TAB SAB Ectopic Multiple Living  4  2 2  0 0  1  0 2     History reviewed. No pertinent past medical history. Past Surgical History  Procedure Laterality Date  . Cesarean section    . Cesarean section N/A    Family History: family history is not on file. Social History:  reports that she has never smoked. She does not have any smokeless tobacco history on file. She reports that she does not drink alcohol or use illicit drugs.   Prenatal Transfer Tool  Maternal Diabetes: No no records  Genetic Screening: Normal  no records  Maternal Ultrasounds/Referrals: Normal no records  Fetal Ultrasounds or other Referrals:  None26 week u/s normal  Maternal Substance Abuse:  No declines  Significant Maternal Medications:  None pnv Significant Maternal Lab Results:  None Other Comments:  None  ROSnone    Dilation: 2 Exam by:: Sigmon, Merridith, CNM Blood pressure 122/69, pulse 73, temperature 98.4 F (36.9 C), temperature source Oral, resp. rate 16, last menstrual period 08/01/2014, unknown if currently breastfeeding. Exam Physical Exam  Lungs CTA  CV RRR without murmur  Abd soft NT , gravid  FHR 130 + accels , no decels       Prenatal labs: ABO, Rh: --/--/O POS (04/09 0703) Antibody:   Rubella:   RPR:    HBsAg:    HIV:    GBS:     Assessment/Plan: 36+2 weeks , active labor prior c/s  Poor prenatal care  Episodes of falling  With a prior h/o of placenta abruption  Labs pending ( drug screen negative )  Pt has been  counseled for repeat c/s and the risks including organ injury , risk of bleeding and the possible need for blood products . Risks of infection HIV , hepatitis etc if blood is given . Infection risks . Pt is aware of the possibility of nursery care for the baby given her prenatal status .   Sanya Kobrin 04/18/2015, 7:37 PM

## 2015-04-18 NOTE — Progress Notes (Signed)
Dr. Bonney AidStaebler was notified about patient, but after chart review, it was determined that the patient has been seen by Dr. Feliberto GottronSchermerhorn on a previous visit. [facility], the patient will continue care with that practice.

## 2015-04-18 NOTE — Anesthesia Preprocedure Evaluation (Signed)
Anesthesia Evaluation  Patient identified by MRN, date of birth, ID band Patient awake    Reviewed: Allergy & Precautions, NPO status   History of Anesthesia Complications Negative for: history of anesthetic complications  Airway Mallampati: II       Dental  (+) Missing, Poor Dentition, Chipped   Pulmonary neg pulmonary ROS,           Cardiovascular negative cardio ROS       Neuro/Psych negative neurological ROS     GI/Hepatic negative GI ROS, Neg liver ROS,   Endo/Other  negative endocrine ROS  Renal/GU negative Renal ROS     Musculoskeletal   Abdominal   Peds  Hematology negative hematology ROS (+)   Anesthesia Other Findings   Reproductive/Obstetrics (+) Pregnancy                             Anesthesia Physical Anesthesia Plan  ASA: II and emergent  Anesthesia Plan: Spinal   Post-op Pain Management:    Induction:   Airway Management Planned:   Additional Equipment:   Intra-op Plan:   Post-operative Plan:   Informed Consent: I have reviewed the patients History and Physical, chart, labs and discussed the procedure including the risks, benefits and alternatives for the proposed anesthesia with the patient or authorized representative who has indicated his/her understanding and acceptance.     Plan Discussed with:   Anesthesia Plan Comments:         Anesthesia Quick Evaluation

## 2015-04-18 NOTE — Transfer of Care (Signed)
Immediate Anesthesia Transfer of Care Note  Patient: [name]  Procedure(s) Performed: Procedure(s): CESAREAN SECTION (N/A)  Patient Location: PACU  Anesthesia Type:Spinal  Level of Consciousness: awake, alert , oriented and patient cooperative  Airway & Oxygen Therapy: Patient Spontanous Breathing  Post-op Assessment: Report given to RN and Post -op Vital signs reviewed and stable  Post vital signs: Reviewed and stable  Last Vitals:  Filed Vitals:   04/18/15 2149  BP: 118/57  Pulse: 93  Temp: 36.5 C  Resp: 16    Complications: No apparent anesthesia complications

## 2015-04-19 ENCOUNTER — Encounter: Payer: Self-pay | Admitting: Obstetrics and Gynecology

## 2015-04-19 LAB — CBC
HCT: 30.8 % — ABNORMAL LOW (ref 35.0–47.0)
Hemoglobin: 10.4 g/dL — ABNORMAL LOW (ref 12.0–16.0)
MCH: 31.8 pg (ref 26.0–34.0)
MCHC: 33.8 g/dL (ref 32.0–36.0)
MCV: 93.9 fL (ref 80.0–100.0)
PLATELETS: 183 10*3/uL (ref 150–440)
RBC: 3.28 MIL/uL — ABNORMAL LOW (ref 3.80–5.20)
RDW: 11.9 % (ref 11.5–14.5)
WBC: 7.6 10*3/uL (ref 3.6–11.0)

## 2015-04-19 LAB — CULTURE, BETA STREP (GROUP B ONLY)

## 2015-04-19 LAB — ABO/RH: ABO/RH(D): O POS

## 2015-04-19 LAB — HEPATITIS B SURFACE ANTIGEN: HEP B S AG: NEGATIVE

## 2015-04-19 NOTE — Progress Notes (Signed)
POD#1  S: Doing well. Not yet OOB, no flatus, pain well controlled. Breast feeding  O: Filed Vitals:   04/19/15 0124 04/19/15 0227 04/19/15 0603 04/19/15 0803  BP: 120/75 121/73 113/49 103/55  Pulse: 71 66 68 70  Temp: 97.9 F (36.6 C) 97.7 F (36.5 C) 98.4 F (36.9 C) 98.2 F (36.8 C)  TempSrc: Oral Oral Oral Oral  Resp: 18 18 16 16   Height:      Weight:      SpO2: 96% 99% 98%    General appearance: alert, cooperative and no distress Lungs: clear to auscultation bilaterally Breasts: normal appearance, no masses or tenderness Heart: regular rate and rhythm, S1, S2 normal, no murmur, click, rub or gallop Abdomen: soft, +BS, appropriately tender, On Q in place, dressing in place Extremities: extremities normal, atraumatic, no cyanosis or edema  CBC Latest Ref Rng 04/19/2015 04/18/2015 09/08/2014  WBC 3.6 - 11.0 K/uL 7.6 5.8 9.1  Hemoglobin 12.0 - 16.0 g/dL 10.4(L) 11.5(L) 12.2  Hematocrit 35.0 - 47.0 % 30.8(L) 33.3(L) 37.5  Platelets 150 - 440 K/uL 183 209 258   A/P: 24yo [id number] POD#1 s/p 2'LTCS for labor on previous scar. Doing well.  1. POD#1 goals - OOB - d/c foley - heplock IV with PO - adat - pain control   2. OB - breast feeding - will discuss BCM  Ala DachJohanna K Halfon, MD

## 2015-04-19 NOTE — Anesthesia Post-op Follow-up Note (Signed)
  Anesthesia Pain Follow-up Note  Patient: Karilyn CotaHollie S Hada  Day #: 1  Date of Follow-up: 04/19/2015 Time: 7:45 AM  Last Vitals:  Filed Vitals:   04/19/15 0603  BP: 113/49  Pulse: 68  Temp: 36.9 C  Resp: 16    Level of Consciousness: alert  Pain: none   Side Effects:None  Catheter Site Exam:clean, dry, no drainage  Plan: D/C from anesthesia care  Chong SicilianLopez,  Anan Dapolito

## 2015-04-19 NOTE — Op Note (Signed)
NAMPatty Sermons:  Wrigley, Sue               ACCOUNT NO.:  [id number]  MEDICAL RECORD NO.:  [id number]  LOCATION:  345A                         FACILITY:  [facility]  PHYSICIAN:  Jennell Cornerhomas Schermerhorn, MDDATE OF BIRTH:  [birth year]  DATE OF PROCEDURE: DATE OF DISCHARGE:                              OPERATIVE REPORT   PREOPERATIVE DIAGNOSIS:  36+ 2 weeks active labor, with history of prior cesarean section.  POSTOPERATIVE DIAGNOSIS:  36+ 2 weeks active labor, with history of prior cesarean section.  PROCEDURE: 1. Repeat low transverse cesarean section. 2. On-Q pump placement.  ANESTHESIA:  Spinal.  SURGEON:  Jennell Cornerhomas Schermerhorn, M.D.  FIRST ASSISTANT:  Steelman.  INDICATION:  This is a 24 year old, gravida 4, para 2.  The patient's current pregnancy is at 36+ 2 weeks, admitted to labor and delivery in active labor with cervix 2-3 cm contracting 1-3 minutes painfully.  The patient had a history of a prior cesarean section.  DESCRIPTION OF PROCEDURE:  After adequate spinal anesthesia, the patient was placed in dorsal supine position.  Hip prone on the right side.  The patient's abdomen was prepped and draped in normal sterile fashion.  A Pfannenstiel incision was made 2 fingerbreadths above the symphysis pubis.  Sharp dissection was used to identify the fascia.  Fascia was opened in the midline and opened in a transverse fashion.  The superior aspect of the fascia was grasped with Kocher clamps and the recti muscles dissected free.  The inferior aspect of the fascia was grasped with Kocher clamps, and pyramidalis muscles dissected free.  Entry into the peritoneal cavity was accomplished sharply.  The vesicular-uterine peritoneal fold was identified.  A bladder flap was created and the bladder was reflected inferiorly.  A low transverse uterine incision was made upon entry into the endometrial cavity.  Clear fluid resulted.  The uterine incision was extended with blunt transverse traction.   Fetal head was brought to the incision and vacuum was applied to the occiput and with 1 gentle pull, the fetal head was delivered and the vacuum was removed.  Shoulders and body were delivered without difficulty.  A vigorous female was delivered.  Assigned Apgar scores of 8 and 9.  Fetal weight 2730 g.  Cord was doubly clamped, and the fetus was passed to the nursery staff.  Apgars were as above.  The placenta was manually delivered and intravenous Pitocin was administered.  Uterus was exteriorized and the endometrial cavity was wiped clean with laparotomy tape.  Ring forceps used to open the cervix and this was passed off the operative field.  Uterine incision was closed with 1 chromic suture in a running, locking fashion.  Good hemostasis noted.  Fallopian tubes and ovaries appeared normal.  Posterior cul-de-sac was suctioned and irrigated.  Uterus was placed back in the abdominal cavity and the pericolic gutters were wiped and cleaned with laparotomy tape.  The uterine incision again appeared hemostatic.  An Interceed was placed over the uterine incision in a T-shaped fashion.  The superior aspect of the fascia was regrasped with Kocher clamps, and the on-Q pump catheters were advanced from infraumbilical position to a subfascial position. Fascia was then closed over top of these catheters using  0 Vicryl suture in a running, nonlocking fashion.  Good approximation of edges.  Good hemostasis noted.  Subcutaneous tissues were irrigated and bovied for hemostasis.  Skin was reapproximated with staples.  The on-Q pump catheters were secured at the skin level with LiquiBand and Steri-Strips and Tegaderm was placed over top of these catheters and each catheter was then loaded with 5 mL of 0.5% Marcaine.  COMPLICATIONS:  There were no complications.  ESTIMATED BLOOD LOSS:  500 mL.  INTRAOPERATIVE FLUIDS:  750 mL.  The patient did receive 2 g IV Ancef prior to commencement of the  case.          ______________________________ Jennell Corner, MD     TS/MEDQ  D:  04/18/2015  T:  04/19/2015  Job:  [id number]

## 2015-04-19 NOTE — Anesthesia Postprocedure Evaluation (Signed)
  Anesthesia Post-op Note  Patient: [name]  Procedure(s) Performed: Procedure(s): CESAREAN SECTION (N/A)  Anesthesia type:Spinal  Patient location: 345  Post pain: Pain level controlled  Post assessment: Post-op Vital signs reviewed, Patient's Cardiovascular Status Stable, Respiratory Function Stable, Patent Airway and No signs of Nausea or vomiting  Post vital signs: Reviewed and stable  Last Vitals:  Filed Vitals:   04/19/15 0603  BP: 113/49  Pulse: 68  Temp: 36.9 C  Resp: 16    Level of consciousness: awake, alert  and patient cooperative  Complications: No apparent anesthesia complications

## 2015-04-20 LAB — VARICELLA ZOSTER ANTIBODY, IGG: VARICELLA IGG: 368 {index} (ref >165)

## 2015-04-20 LAB — RPR: RPR: NONREACTIVE

## 2015-04-20 LAB — RUBELLA SCREEN
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
## 2015-04-21 MED ORDER — DOCUSATE SODIUM 100 MG PO CAPS: 100.0000 mg | ORAL_CAPSULE | Freq: Two times a day (BID) | ORAL | Status: DC | PRN

## 2015-04-21 MED ORDER — OXYCODONE-ACETAMINOPHEN 5-325 MG PO TABS: 1.0000 | ORAL_TABLET | ORAL | Status: DC | PRN

## 2015-04-21 MED ORDER — MEASLES, MUMPS & RUBELLA VAC ~~LOC~~ INJ
0.5000 mL | INJECTION | Freq: Once | SUBCUTANEOUS | Status: AC
  Administered 2015-04-21: 0.5 mL via SUBCUTANEOUS
  Filled 2015-04-21: qty 0.5

## 2015-04-21 MED ORDER — IBUPROFEN 600 MG PO TABS: 600.0000 mg | ORAL_TABLET | Freq: Four times a day (QID) | ORAL | Status: DC

## 2015-04-21 NOTE — Progress Notes (Signed)
Discharge instructions provided.  Pt and sig other verbalize understanding of all instructions and follow-up care.  Prescriptions and Incision Hygiene kit given.  Pt discharged to home with infant at 1910 on 04/21/15 via wheelchair by RN. Reed Breech, RN 04/21/2015 7:17 PM
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
## 2015-04-21 NOTE — Progress Notes (Signed)
Infant CPR video watched by patient and reviewed with RN. Patient verbalized understanding and return demonstrated CPR procedure.

## 2015-04-21 NOTE — Progress Notes (Signed)
Pt states that she received Infuenza vaccine during pregnancy from work.  Pt declines Influenza vaccine at this time. Reynold BowenSusan Paisley Delance Weide, RN 04/21/2015 4:49 PM

## 2015-04-21 NOTE — Discharge Instructions (Signed)
Postpartum Care After Cesarean Delivery  Congratulations on your new baby!  After you deliver your newborn (postpartum period), the usual stay in the hospital is 48-72 hours. If there were problems with your labor or delivery, or if you have other medical problems, you might be in the hospital longer.  While you are in the hospital, you will receive help and instructions on how to care for yourself and your newborn during the postpartum period.   While you are in the hospital:  It is normal for you to have pain or discomfort from the incision in your abdomen. Be sure to tell your nurses when you are having pain, where the pain is located, and what makes the pain worse.  If you are breastfeeding, you may feel uncomfortable contractions of your uterus for a couple of weeks. This is normal. The contractions help your uterus get back to normal size.  It is normal to have some bleeding after delivery.  For the first 1-3 days after delivery, the flow is red and the amount may be similar to a period.  It is common for the flow to start and stop.  In the first few days, you may pass some small clots. Let your nurses know if you begin to pass large clots or your flow increases.  Do not flush blood clots down the toilet before having the nurse look at them.  During the next 3-10 days after delivery, your flow should become more watery and pink or brown-tinged in color.  Ten to fourteen days after delivery, your flow should be a small amount of yellowish-white discharge.  The amount of your flow will decrease over the first few weeks after delivery. Your flow may stop in 6-8 weeks. Most women have had their flow stop by 12 weeks after delivery.  You should change your sanitary pads frequently.  Wash your hands thoroughly with soap and water for at least 20 seconds after changing pads, using the toilet, or before holding or feeding your newborn.  Your intravenous (IV) tubing will be removed when you are  drinking enough fluids.  The urine drainage tube (urinary catheter) that was inserted before delivery may be removed within 6-8 hours after delivery or when feeling returns to your legs. You should feel like you need to empty your bladder within the first 6-8 hours after the catheter has been removed.  In case you become weak, lightheaded, or faint, call your nurse before you get out of bed for the first time and before you take a shower for the first time.  Within the first few days after delivery, your breasts may begin to feel tender and full. This is called engorgement. Breast tenderness usually goes away within 48-72 hours after engorgement occurs. You may also notice milk leaking from your breasts. If you are not breastfeeding, do not stimulate your breasts. Breast stimulation can make your breasts produce more milk.  Spending as much time as possible with your newborn is very important. During this time, you and your newborn can feel close and get to know each other. Having your newborn stay in your room (rooming in) will help to strengthen the bond with your newborn. It will give you time to get to know your newborn and become comfortable caring for your newborn.  Sleep as much as you can in the days and weeks after delivery. Your hormone changes, a baby who doesn't know night from day and therefore keeps you awake, and recovering from surgery  can all conspire to depress your mood. Give your body time to heal. Give your emotions time to settle.  Your hormones change after delivery. Sometimes the hormone changes can temporarily cause you to feel sad or tearful. These feelings should not last more than a few days. If these feelings last longer than that, you should talk to your caregiver.  If desired, talk to your caregiver about methods of family planning or contraception.  Talk to your caregiver about immunizations. Your caregiver may want you to have the following immunizations before leaving the  hospital:  Tetanus, diphtheria, and pertussis (Tdap) or tetanus and diphtheria (Td) immunization. It is very important that you and your family (including grandparents) or others caring for your newborn are up-to-date with the Tdap or Td immunizations. The Tdap or Td immunization can help protect your newborn from getting ill.  Rubella immunization.  Varicella (chickenpox) immunization.  Influenza immunization. You should receive this annual immunization if you did not receive the immunization during your pregnancy, to help you and protect your baby.   This information is not intended to replace advice given to you by your health care provider. Make sure you discuss any questions you have with your health care provider.  Document Released: 02/10/2012 Document Reviewed: 02/10/2012  Elsevier Interactive Patient Education [employer]2016 [employer]vier Inc.   Call your doctor for increased pain or vaginal bleeding, temperature above 100.4, depression, or concerns.  No strenuous activity or heavy lifting for 6 weeks.  No intercourse, tampons, douching, or enemas for 6 weeks.  No tub baths-showers only.  No driving for 2 weeks or while taking pain medications.  Continue prenatal vitamin and iron.  Keep incision clean and dry.  Call your doctor for incision concerns including redness, swelling, bleeding or drainage, or if begins to come apart.  Increase calories and fluids while breastfeeding.

## 2015-04-21 NOTE — Progress Notes (Signed)
Subjective: Postpartum Day 3: Cesarean Delivery Patient reports incisional pain and no problems voiding.    Objective: Vital signs in last 24 hours: Temp:  [97.9 F (36.6 C)-98.7 F (37.1 C)] 97.9 F (36.6 C) (11/20 0744) Pulse Rate:  [72] 72 (11/20 0744) Resp:  [18] 18 (11/20 0744) BP: (130)/(73) 130/73 mmHg (11/20 0744) SpO2:  [98 %] 98 % (11/20 0744)  Physical Exam:  General: alert, cooperative and no distress Lochia: appropriate Uterine Fundus: firm Incision: healing well, no significant drainage, no dehiscence, no significant erythema DVT Evaluation: No evidence of DVT seen on physical exam.   Recent Labs  04/18/15 1951 04/19/15 0631  HGB 11.5* 10.4*  HCT 33.3* 30.8*    Assessment/Plan: Status post Cesarean section. Doing well postoperatively.  Pt may stay for 1 more day secondary to postop pain.  Christeen DouglasBEASLEY, Khamille Beynon 04/21/2015, 11:19 AM

## 2015-04-21 NOTE — Progress Notes (Signed)
Dr. Dalbert GarnetBeasley contacted to notify of hematoma under incision.  MD states to not remove staples at this time, and to instruct [facility].  Pt verbalizes understanding. Reynold BowenSusan Paisley Adem Costlow, RN 04/21/2015 7:22 PM

## 2018-07-22 ENCOUNTER — Emergency Department: Payer: BLUE CROSS/BLUE SHIELD

## 2018-07-22 ENCOUNTER — Other Ambulatory Visit: Payer: Self-pay

## 2018-07-22 ENCOUNTER — Encounter: Payer: Self-pay | Admitting: Emergency Medicine

## 2018-07-22 ENCOUNTER — Emergency Department
Admission: EM | Admit: 2018-07-22 | Discharge: 2018-07-23 | Disposition: A | Discharge status: Home / Self Care | Payer: BLUE CROSS/BLUE SHIELD | Attending: Emergency Medicine | Admitting: Emergency Medicine

## 2018-07-22 DIAGNOSIS — Z79899 Other long term (current) drug therapy: Secondary | ICD-10-CM | POA: Insufficient documentation

## 2018-07-22 DIAGNOSIS — J40 Bronchitis, not specified as acute or chronic: Secondary | ICD-10-CM | POA: Insufficient documentation

## 2018-07-22 DIAGNOSIS — R0682 Tachypnea, not elsewhere classified: Secondary | ICD-10-CM | POA: Diagnosis present

## 2018-07-22 LAB — COMPREHENSIVE METABOLIC PANEL
ALT: 16 U/L (ref 0–44)
AST: 19 U/L (ref 15–41)
Albumin: 4.2 g/dL (ref 3.5–5.0)
Alkaline Phosphatase: 64 U/L (ref 38–126)
Anion gap: 9 (ref 5–15)
BUN: 17 mg/dL (ref 6–20)
CO2: 20 mmol/L — ABNORMAL LOW (ref 22–32)
Calcium: 9 mg/dL (ref 8.9–10.3)
Chloride: 108 mmol/L (ref 98–111)
Creatinine, Ser: 0.67 mg/dL (ref 0.44–1.00)
GFR calc Af Amer: >60 mL/min (ref >60)
GFR calc non Af Amer: >60 mL/min (ref >60)
Glucose, Bld: 138 mg/dL — ABNORMAL HIGH (ref 70–99)
Potassium: 3.2 mmol/L — ABNORMAL LOW (ref 3.5–5.1)
Sodium: 137 mmol/L (ref 135–145)
Total Bilirubin: 0.7 mg/dL (ref 0.3–1.2)
Total Protein: 7.6 g/dL (ref 6.5–8.1)

## 2018-07-22 LAB — CBC WITH DIFFERENTIAL/PLATELET
Abs Immature Granulocytes: 0.03 10*3/uL (ref 0.00–0.07)
Basophils Absolute: 0 10*3/uL (ref 0.0–0.1)
Basophils Relative: 1 %
Eosinophils Absolute: 0.2 10*3/uL (ref 0.0–0.5)
Eosinophils Relative: 2 %
HCT: 39.6 % (ref 36.0–46.0)
Hemoglobin: 13.4 g/dL (ref 12.0–15.0)
Immature Granulocytes: 0 %
Lymphocytes Relative: 27 %
Lymphs Abs: 2.2 10*3/uL (ref 0.7–4.0)
MCH: 31.2 pg (ref 26.0–34.0)
MCHC: 33.8 g/dL (ref 30.0–36.0)
MCV: 92.3 fL (ref 80.0–100.0)
Monocytes Absolute: 0.8 10*3/uL (ref 0.1–1.0)
Monocytes Relative: 10 %
Neutro Abs: 5 10*3/uL (ref 1.7–7.7)
Neutrophils Relative %: 60 %
Platelets: 286 10*3/uL (ref 150–400)
RBC: 4.29 MIL/uL (ref 3.87–5.11)
RDW: 11.7 % (ref 11.5–15.5)
WBC: 8.2 10*3/uL (ref 4.0–10.5)
nRBC: 0 % (ref 0.0–0.2)

## 2018-07-22 LAB — TROPONIN I: Troponin I: <0.03 ng/mL (ref <0.03)

## 2018-07-22 MED ORDER — IOHEXOL 350 MG/ML SOLN
75.0000 mL | Freq: Once | INTRAVENOUS | Status: AC | PRN
  Administered 2018-07-22: 75 mL via INTRAVENOUS
  Filled 2018-07-22: qty 75

## 2018-07-22 MED ORDER — ALBUTEROL SULFATE (2.5 MG/3ML) 0.083% IN NEBU
5.0000 mg | INHALATION_SOLUTION | Freq: Once | RESPIRATORY_TRACT | Status: AC
  Administered 2018-07-22: 5 mg via RESPIRATORY_TRACT
  Filled 2018-07-22: qty 6

## 2018-07-22 NOTE — ED Triage Notes (Signed)
Pt c/o dry cough x2 days. Hx/o bronchitis. Pt has used albuterol inhalers in past but does not currently have prescription.

## 2018-07-22 NOTE — ED Provider Notes (Signed)
[facility] Emergency Department Provider Note  ____________________________________________  Time seen: Approximately 11:49 PM  I have reviewed the triage vital signs and the nursing notes.   HISTORY  Chief Complaint Cough    HPI [name] is a 28 y.o. female presents to the emergency department with tachypnea.  Patient reports that she has had nonproductive cough for the past 2 days.  Patient states that she is short of breath.  She denies nasal congestion, rhinorrhea or fever.  She recently adjusted her oral contraceptives in January and is currently not taking any type of oral contraceptives now.  She has a history of obesity but denies daily smoking.  No prolonged immobilization, recent travel or recent surgery.  She denies calf pain.  No knowledge of any type of clotting disorder.  Patient reports that she has not experienced similar symptoms in the past.  Patient denies possibility of pregnancy.   History reviewed. No pertinent past medical history.  Patient Active Problem List   Diagnosis Date Noted  . Irregular contractions 04/18/2015  . Postoperative care for cataract 04/18/2015    Past Surgical History:  Procedure Laterality Date  . CESAREAN SECTION    . CESAREAN SECTION N/A   . CESAREAN SECTION N/A 04/18/2015   Procedure: CESAREAN SECTION;  Surgeon: Suzy Bouchard, MD;  Location: [facility] ORS;  Service: Obstetrics;  Laterality: N/A;    Prior to Admission medications   Medication Sig Start Date End Date Taking? Authorizing Provider  benzonatate (TESSALON PERLES) 100 MG capsule Take 1 capsule (100 mg total) by mouth 3 (three) times daily as needed for up to 7 days for cough. 07/23/18 07/30/18  Orvil Feil, PA-C  docusate sodium (COLACE) 100 MG capsule Take 1 capsule (100 mg total) by mouth 2 (two) times daily as needed. 04/21/15   Christeen Douglas, MD  ibuprofen (ADVIL,MOTRIN) 600 MG tablet Take 1 tablet (600 mg total) by mouth every  6 (six) hours. 04/21/15   Christeen Douglas, MD  ondansetron (ZOFRAN ODT) 4 MG disintegrating tablet 4mg  ODT q4 hours prn nausea/vomit Patient not taking: Reported on 02/07/2015 09/08/14   Bethann Berkshire, MD  oxyCODONE-acetaminophen (PERCOCET/ROXICET) 5-325 MG tablet Take 1-2 tablets by mouth every 4 (four) hours as needed (for pain scale greater than 7). 04/21/15   Christeen Douglas, MD  predniSONE (DELTASONE) 50 MG tablet Take one 50 mg tablet once daily for the next five days. 07/23/18   Orvil Feil, PA-C  prenatal vitamin w/FE, FA (PRENATAL 1 + 1) 27-1 MG TABS Take 1 tablet by mouth every morning.    [provider]    Allergies Patient has no known allergies.  History reviewed. No pertinent family history.  Social History Social History   Tobacco Use  . Smoking status: Never Smoker  . Smokeless tobacco: Never Used  Substance Use Topics  . Alcohol use: No  . Drug use: No     Review of Systems  Constitutional: No fever/chills Eyes: No visual changes. No discharge ENT: No upper respiratory complaints. Cardiovascular: no chest pain. Respiratory: Patient has SOB and cough.  Gastrointestinal: No abdominal pain.  No nausea, no vomiting.  No diarrhea.  No constipation. Genitourinary: Negative for dysuria. No hematuria Musculoskeletal: Negative for musculoskeletal pain. Skin: Negative for rash, abrasions, lacerations, ecchymosis. Neurological: Negative for headaches, focal weakness or numbness.   ____________________________________________   PHYSICAL EXAM:  VITAL SIGNS: ED Triage Vitals [07/22/18 2034]  Enc Vitals Group     BP 140/77  Pulse Rate 92     Resp (!) 21     Temp 98.9 F (37.2 C)     Temp Source Oral     SpO2 98 %     Weight      Height      Head Circumference      Peak Flow      Pain Score      Pain Loc      Pain Edu?      Excl. in GC?      Constitutional: Alert and oriented. Well appearing and in no acute distress.  Patient is  tachypneic in exam room.  When I ask family members to leave the room, her respiratory rate slows considerably.  Eyes: Conjunctivae are normal. PERRL. EOMI. Head: Atraumatic. ENT:      Ears: TMs are pearly.       Nose: No congestion/rhinnorhea.      Mouth/Throat: Mucous membranes are moist.  Neck: No stridor.  No cervical spine tenderness to palpation. Cardiovascular: Normal rate, regular rhythm. Normal S1 and S2.  Good peripheral circulation. Respiratory: Normal respiratory effort without tachypnea or retractions. Lungs CTAB. Good air entry to the bases with no decreased or absent breath sounds. Musculoskeletal: Full range of motion to all extremities. No gross deformities appreciated. Neurologic:  Normal speech and language. No gross focal neurologic deficits are appreciated.  Skin:  Skin is warm, dry and intact. No rash noted. Psychiatric: Mood and affect are normal. Speech and behavior are normal. Patient exhibits appropriate insight and judgement.   ____________________________________________   LABS (all labs ordered are listed, but only abnormal results are displayed)  Labs Reviewed  COMPREHENSIVE METABOLIC PANEL - Abnormal; Notable for the following components:      Result Value   Potassium 3.2 (*)    CO2 20 (*)    Glucose, Bld 138 (*)    All other components within normal limits  CBC WITH DIFFERENTIAL/PLATELET  TROPONIN I   ____________________________________________  EKG   ____________________________________________  RADIOLOGY I personally viewed and evaluated these images as part of my medical decision making, as well as reviewing the written report by the radiologist.  Dg Chest 2 View  Result Date: 07/22/2018 CLINICAL DATA:  Cough EXAM: CHEST - 2 VIEW COMPARISON:  05/16/2012 FINDINGS: The heart size and mediastinal contours are within normal limits. Both lungs are clear. The visualized skeletal structures are unremarkable. IMPRESSION: No active cardiopulmonary  disease. Electronically Signed   By: Marlan Palau M.D.   On: 07/22/2018 21:03   Ct Angio Chest Pe W And/or Wo Contrast  Result Date: 07/23/2018 CLINICAL DATA:  28 year old female with cough. Concern for pulmonary embolism. EXAM: CT ANGIOGRAPHY CHEST WITH CONTRAST TECHNIQUE: Multidetector CT imaging of the chest was performed using the standard protocol during bolus administration of intravenous contrast. Multiplanar CT image reconstructions and MIPs were obtained to evaluate the vascular anatomy. CONTRAST:  55mL OMNIPAQUE IOHEXOL 350 MG/ML SOLN COMPARISON:  Chest radiograph dated 07/22/2018 FINDINGS: Cardiovascular: Top-normal cardiac size. No pericardial effusion. The thoracic aorta is unremarkable. The origins of the great vessels of the aortic arch are patent. Evaluation of the pulmonary arteries is limited due to respiratory motion artifact. There is no CT evidence of pulmonary embolism. Mediastinum/Nodes: No hilar or mediastinal adenopathy. Small hiatal hernia. The esophagus and the thyroid gland are grossly unremarkable. No mediastinal fluid collection. Lungs/Pleura: The lungs are clear. There is no pleural effusion or pneumothorax. The central airways are patent. Upper Abdomen: No acute abnormality. Musculoskeletal:  No chest wall abnormality. No acute or significant osseous findings. Review of the MIP images confirms the above findings. IMPRESSION: No acute intrathoracic pathology. No CT evidence of pulmonary embolism. Electronically Signed   By: Elgie CollardArash  Radparvar M.D.   On: 07/23/2018 00:09    ____________________________________________    PROCEDURES  Procedure(s) performed:    Procedures    Medications  predniSONE (DELTASONE) tablet 60 mg (has no administration in time range)  albuterol (PROVENTIL) (2.5 MG/3ML) 0.083% nebulizer solution 5 mg (5 mg Nebulization Given 07/22/18 2035)  iohexol (OMNIPAQUE) 350 MG/ML injection 75 mL (75 mLs Intravenous Contrast Given 07/22/18 2348)      ____________________________________________   INITIAL IMPRESSION / ASSESSMENT AND PLAN / ED COURSE  Pertinent labs & imaging results that were available during my care of the patient were reviewed by me and considered in my medical decision making (see chart for details).  Review of the [place] CSRS was performed in accordance of the [facility] prior to dispensing any controlled drugs.      Assessment and Plan:  Bronchitis Patient presents to the emergency department with shortness of breath and cough for the past 2 days.  Patient is heard coughing multiple times in the emergency department.  Chest x-ray revealed no consolidations, opacities or infiltrates that would suggest pneumonia.  Due to tachypnea, mild obesity and changing contraceptives, there was concern for PE.  CT chest was reassuring without acute abnormality.  Patient was discharged with prednisone and Tessalon Perles.  She was advised to follow-up with primary care as needed.  All patient questions were answered.   ____________________________________________  FINAL CLINICAL IMPRESSION(S) / ED DIAGNOSES  Final diagnoses:  Bronchitis      NEW MEDICATIONS STARTED DURING THIS VISIT:  ED Discharge Orders         Ordered    benzonatate (TESSALON PERLES) 100 MG capsule  3 times daily PRN     07/23/18 0016    predniSONE (DELTASONE) 50 MG tablet     07/23/18 0016              This chart was dictated using voice recognition software/Dragon. Despite best efforts to proofread, errors can occur which can change the meaning. Any change was purely unintentional.    Orvil FeilWoods, Arne Schlender M, PA-C 07/23/18 0021    Arnaldo NatalMalinda, Paul F, MD 07/26/18 [id number]

## 2018-07-23 MED ORDER — BENZONATATE 100 MG PO CAPS: 100.0000 mg | ORAL_CAPSULE | Freq: Three times a day (TID) | ORAL | 0 refills | Status: AC | PRN

## 2018-07-23 MED ORDER — PREDNISONE 50 MG PO TABS: ORAL_TABLET | ORAL | 0 refills | Status: DC

## 2018-07-23 MED ORDER — PREDNISONE 20 MG PO TABS
60.0000 mg | ORAL_TABLET | Freq: Once | ORAL | Status: AC
  Administered 2018-07-23: 60 mg via ORAL
  Filled 2018-07-23: qty 3

## 2018-08-29 ENCOUNTER — Encounter: Payer: Self-pay | Admitting: Emergency Medicine

## 2018-08-29 ENCOUNTER — Emergency Department
Admission: EM | Admit: 2018-08-29 | Discharge: 2018-08-29 | Disposition: A | Discharge status: Home / Self Care | Payer: BLUE CROSS/BLUE SHIELD | Attending: Emergency Medicine | Admitting: Emergency Medicine

## 2018-08-29 ENCOUNTER — Other Ambulatory Visit: Payer: Self-pay

## 2018-08-29 DIAGNOSIS — T7840XA Allergy, unspecified, initial encounter: Secondary | ICD-10-CM | POA: Insufficient documentation

## 2018-08-29 DIAGNOSIS — Z79899 Other long term (current) drug therapy: Secondary | ICD-10-CM | POA: Insufficient documentation

## 2018-08-29 DIAGNOSIS — R21 Rash and other nonspecific skin eruption: Secondary | ICD-10-CM | POA: Diagnosis present

## 2018-08-29 MED ORDER — PREDNISONE 50 MG PO TABS: 50.0000 mg | ORAL_TABLET | Freq: Every day | ORAL | 0 refills | Status: DC

## 2018-08-29 MED ORDER — DEXAMETHASONE SODIUM PHOSPHATE 10 MG/ML IJ SOLN
10.0000 mg | Freq: Once | INTRAMUSCULAR | Status: AC
  Administered 2018-08-29: 10 mg via INTRAMUSCULAR
  Filled 2018-08-29: qty 1

## 2018-08-29 MED ORDER — DIPHENHYDRAMINE HCL 50 MG/ML IJ SOLN
50.0000 mg | Freq: Once | INTRAMUSCULAR | Status: AC
  Administered 2018-08-29: 50 mg via INTRAMUSCULAR
  Filled 2018-08-29: qty 1

## 2018-08-29 MED ORDER — FAMOTIDINE 20 MG PO TABS
20.0000 mg | ORAL_TABLET | Freq: Once | ORAL | Status: AC
  Administered 2018-08-29: 20 mg via ORAL
  Filled 2018-08-29: qty 1

## 2018-08-29 NOTE — ED Provider Notes (Signed)
[facility] Emergency Department Provider Note  ____________________________________________  Time seen: Approximately 10:53 PM  I have reviewed the triage vital signs and the nursing notes.   HISTORY  Chief Complaint Allergic Reaction    HPI [name] is a 28 y.o. female who presents the emergency department complaining of rash/possible allergic reaction.  Patient reports that she was bit by an unknown insect to the left medial arm and then developed a worsening rash throughout the past 6 hours.  Patient denies any airway involvement or shortness of breath.  She states that the rash is pruritic in nature.  No new foods, medications, topicals.  She is not allergic to anything that she knows of.  She did take Benadryl prior to arrival with no relief of symptoms.         History reviewed. No pertinent past medical history.  Patient Active Problem List   Diagnosis Date Noted  . Irregular contractions 04/18/2015  . Postoperative care for cataract 04/18/2015    Past Surgical History:  Procedure Laterality Date  . CESAREAN SECTION    . CESAREAN SECTION N/A   . CESAREAN SECTION N/A 04/18/2015   Procedure: CESAREAN SECTION;  Surgeon: Suzy Bouchard, MD;  Location: [facility] ORS;  Service: Obstetrics;  Laterality: N/A;    Prior to Admission medications   Medication Sig Start Date End Date Taking? Authorizing Provider  docusate sodium (COLACE) 100 MG capsule Take 1 capsule (100 mg total) by mouth 2 (two) times daily as needed. 04/21/15   Christeen Douglas, MD  ibuprofen (ADVIL,MOTRIN) 600 MG tablet Take 1 tablet (600 mg total) by mouth every 6 (six) hours. 04/21/15   Christeen Douglas, MD  ondansetron (ZOFRAN ODT) 4 MG disintegrating tablet 4mg  ODT q4 hours prn nausea/vomit Patient not taking: Reported on 02/07/2015 09/08/14   Bethann Berkshire, MD  oxyCODONE-acetaminophen (PERCOCET/ROXICET) 5-325 MG tablet Take 1-2 tablets by mouth every 4 (four) hours as  needed (for pain scale greater than 7). 04/21/15   Christeen Douglas, MD  predniSONE (DELTASONE) 50 MG tablet Take 1 tablet (50 mg total) by mouth daily with breakfast. 08/29/18   , Delorise Royals, PA-C  prenatal vitamin w/FE, FA (PRENATAL 1 + 1) 27-1 MG TABS Take 1 tablet by mouth every morning.    [provider]    Allergies Patient has no known allergies.  History reviewed. No pertinent family history.  Social History Social History   Tobacco Use  . Smoking status: Never Smoker  . Smokeless tobacco: Never Used  Substance Use Topics  . Alcohol use: No  . Drug use: No     Review of Systems  Constitutional: No fever/chills Eyes: No visual changes. No discharge ENT: No upper respiratory complaints. Cardiovascular: no chest pain. Respiratory: no cough. No SOB. Gastrointestinal: No abdominal pain.  No nausea, no vomiting.  No diarrhea.  No constipation. Musculoskeletal: Negative for musculoskeletal pain. Skin: Positive for diffuse pruritic rash to the torso and bilateral arms. Neurological: Negative for headaches, focal weakness or numbness. 10-point ROS otherwise negative.  ____________________________________________   PHYSICAL EXAM:  VITAL SIGNS: ED Triage Vitals [08/29/18 2250]  Enc Vitals Group     BP 131/71     Pulse Rate 86     Resp 18     Temp 98.5 F (36.9 C)     Temp Source Oral     SpO2 99 %     Weight      Height      Head Circumference  Peak Flow      Pain Score      Pain Loc      Pain Edu?      Excl. in GC?      Constitutional: Alert and oriented. Well appearing and in no acute distress. Eyes: Conjunctivae are normal. PERRL. EOMI. Head: Atraumatic. ENT:      Ears:       Nose: No congestion/rhinnorhea.      Mouth/Throat: Mucous membranes are moist.  No oropharyngeal edema or angioedema. Neck: No stridor.    Cardiovascular: Normal rate, regular rhythm. Normal S1 and S2.  Good peripheral circulation. Respiratory: Normal  respiratory effort without tachypnea or retractions. Lungs CTAB. Good air entry to the bases with no decreased or absent breath sounds. Musculoskeletal: Full range of motion to all extremities. No gross deformities appreciated. Neurologic:  Normal speech and language. No gross focal neurologic deficits are appreciated.  Skin:  Skin is warm, dry and intact.  Scattered maculopapular rash appreciated over bilateral arms, torso.  No facial involvement..  No wheals.  No indication of cellulitis. Psychiatric: Mood and affect are normal. Speech and behavior are normal. Patient exhibits appropriate insight and judgement.   ____________________________________________   LABS (all labs ordered are listed, but only abnormal results are displayed)  Labs Reviewed - No data to display ____________________________________________  EKG   ____________________________________________  RADIOLOGY   No results found.  ____________________________________________    PROCEDURES  Procedure(s) performed:    Procedures    Medications  diphenhydrAMINE (BENADRYL) injection 50 mg (50 mg Intramuscular Given 08/29/18 2317)  dexamethasone (DECADRON) injection 10 mg (10 mg Intramuscular Given 08/29/18 2315)  famotidine (PEPCID) tablet 20 mg (20 mg Oral Given 08/29/18 2314)     ____________________________________________   INITIAL IMPRESSION / ASSESSMENT AND PLAN / ED COURSE  Pertinent labs & imaging results that were available during my care of the patient were reviewed by me and considered in my medical decision making (see chart for details).  Review of the [place] CSRS was performed in accordance of the [facility] prior to dispensing any controlled drugs.           Patient's diagnosis is consistent with allergic reaction.  Patient presented to emergency department with pruritic rash after being bitten by an unknown insect.  No indication of anaphylaxis.  Unknown type of insect.  She has no known  allergies.  Patient is treated with diphenhydramine, Decadron, famotidine.  Patient will be prescribed prednisone at home.  Follow-up primary care as needed..  Patient is given ED precautions to return to the ED for any worsening or new symptoms.     ____________________________________________  FINAL CLINICAL IMPRESSION(S) / ED DIAGNOSES  Final diagnoses:  Allergic reaction, initial encounter      NEW MEDICATIONS STARTED DURING THIS VISIT:  ED Discharge Orders         Ordered    predniSONE (DELTASONE) 50 MG tablet  Daily with breakfast     08/29/18 2310              This chart was dictated using voice recognition software/Dragon. Despite best efforts to proofread, errors can occur which can change the meaning. Any change was purely unintentional.    Racheal Patches, PA-C 08/29/18 2321    Minna Antis, MD 09/01/18 2037

## 2018-08-29 NOTE — ED Notes (Signed)
Patient states having a allergic reaction but does not know to what. Patient states she took 3 benadryl 2 hours prior to arrival. Patient has a red rash all over body with slight itching.

## 2018-08-29 NOTE — ED Triage Notes (Signed)
Pt reports she was bit by unknown insect under the left arm, approximately 6 hours ago and since has developed a rash "all over" body. Pt reports itch to areas but denies SOB.

## 2018-10-05 ENCOUNTER — Emergency Department
Admission: EM | Admit: 2018-10-05 | Discharge: 2018-10-05 | Disposition: A | Discharge status: Home / Self Care | Payer: BLUE CROSS/BLUE SHIELD | Attending: Emergency Medicine | Admitting: Emergency Medicine

## 2018-10-05 ENCOUNTER — Encounter: Payer: Self-pay | Admitting: Emergency Medicine

## 2018-10-05 ENCOUNTER — Other Ambulatory Visit: Payer: Self-pay

## 2018-10-05 DIAGNOSIS — M25561 Pain in right knee: Secondary | ICD-10-CM | POA: Insufficient documentation

## 2018-10-05 MED ORDER — MELOXICAM 7.5 MG PO TABS
15.0000 mg | ORAL_TABLET | Freq: Once | ORAL | Status: AC
  Administered 2018-10-05: 15 mg via ORAL
  Filled 2018-10-05: qty 2

## 2018-10-05 MED ORDER — MELOXICAM 15 MG PO TABS: 15.0000 mg | ORAL_TABLET | Freq: Every day | ORAL | 0 refills | Status: DC

## 2018-10-05 NOTE — ED Provider Notes (Signed)
[facility] Emergency Department Provider Note  ____________________________________________  Time seen: Approximately 10:28 PM  I have reviewed the triage vital signs and the nursing notes.   HISTORY  Chief Complaint Knee Pain    HPI [name] is a 28 y.o. female who presents the emergency department complaining of right knee pain.  Patient reports that there was no involved trauma but she has developed pain behind her kneecap.  Patient reports that it is worsened if she pushes on her kneecap.  No other gross tenderness reported.  Patient denies any edema, erythema.  She tried Tylenol Motrin at home with no relief.  No other injury or complaint.  No recent illnesses.         History reviewed. No pertinent past medical history.  Patient Active Problem List   Diagnosis Date Noted  . Irregular contractions 04/18/2015  . Postoperative care for cataract 04/18/2015    Past Surgical History:  Procedure Laterality Date  . CESAREAN SECTION    . CESAREAN SECTION N/A   . CESAREAN SECTION N/A 04/18/2015   Procedure: CESAREAN SECTION;  Surgeon: Suzy Bouchardhomas J Schermerhorn, MD;  Location: [facility] ORS;  Service: Obstetrics;  Laterality: N/A;    Prior to Admission medications   Medication Sig Start Date End Date Taking? Authorizing Provider  docusate sodium (COLACE) 100 MG capsule Take 1 capsule (100 mg total) by mouth 2 (two) times daily as needed. 04/21/15   Christeen DouglasBeasley, Bethany, MD  ibuprofen (ADVIL,MOTRIN) 600 MG tablet Take 1 tablet (600 mg total) by mouth every 6 (six) hours. 04/21/15   Christeen DouglasBeasley, Bethany, MD  meloxicam (MOBIC) 15 MG tablet Take 1 tablet (15 mg total) by mouth daily. 10/05/18   Iver Miklas, Delorise RoyalsJonathan D, PA-C  ondansetron (ZOFRAN ODT) 4 MG disintegrating tablet 4mg  ODT q4 hours prn nausea/vomit Patient not taking: Reported on 02/07/2015 09/08/14   Bethann BerkshireZammit, Joseph, MD  oxyCODONE-acetaminophen (PERCOCET/ROXICET) 5-325 MG tablet Take 1-2 tablets by mouth every 4  (four) hours as needed (for pain scale greater than 7). 04/21/15   Christeen DouglasBeasley, Bethany, MD  predniSONE (DELTASONE) 50 MG tablet Take 1 tablet (50 mg total) by mouth daily with breakfast. 08/29/18   Jammi Morrissette, Delorise RoyalsJonathan D, PA-C  prenatal vitamin w/FE, FA (PRENATAL 1 + 1) 27-1 MG TABS Take 1 tablet by mouth every morning.    [provider]    Allergies Patient has no known allergies.  No family history on file.  Social History Social History   Tobacco Use  . Smoking status: Never Smoker  . Smokeless tobacco: Never Used  Substance Use Topics  . Alcohol use: No  . Drug use: No     Review of Systems  Constitutional: No fever/chills Eyes: No visual changes.  Cardiovascular: no chest pain. Respiratory: no cough. No SOB. Gastrointestinal: No abdominal pain.  No nausea, no vomiting.  Musculoskeletal: Positive for right knee pain. Skin: Negative for rash, abrasions, lacerations, ecchymosis. Neurological: Negative for headaches, focal weakness or numbness. 10-point ROS otherwise negative.  ____________________________________________   PHYSICAL EXAM:  VITAL SIGNS: ED Triage Vitals  Enc Vitals Group     BP 10/05/18 2048 127/78     Pulse Rate 10/05/18 2048 100     Resp 10/05/18 2048 18     Temp 10/05/18 2048 98.6 F (37 C)     Temp Source 10/05/18 2048 Oral     SpO2 10/05/18 2048 98 %     Weight 10/05/18 2041 150 lb (68 kg)     Height 10/05/18 2041  4\' 11"  (1.499 m)     Head Circumference --      Peak Flow --      Pain Score 10/05/18 2042 7     Pain Loc --      Pain Edu? --      Excl. in GC? --      Constitutional: Alert and oriented. Well appearing and in no acute distress. Eyes: Conjunctivae are normal. PERRL. EOMI. Head: Atraumatic. Neck: No stridor.    Cardiovascular: Normal rate, regular rhythm. Normal S1 and S2.  Good peripheral circulation. Respiratory: Normal respiratory effort without tachypnea or retractions. Lungs CTAB. Good air entry to the bases  with no decreased or absent breath sounds. Musculoskeletal: Full range of motion to all extremities. No gross deformities appreciated.  Examination of the right knee reveals no gross erythema, edema.  No overlying skin changes.  Area is not warm to palpation.  Patient is very tender to palpation over the patella with no palpable abnormality.  No ballottement.  With pressure of the patella, symptoms are reproduced.  No tenderness to palpation.  Dorsalis pedis pulse intact distally. Neurologic:  Normal speech and language. No gross focal neurologic deficits are appreciated.  Skin:  Skin is warm, dry and intact. No rash noted. Psychiatric: Mood and affect are normal. Speech and behavior are normal. Patient exhibits appropriate insight and judgement.   ____________________________________________   LABS (all labs ordered are listed, but only abnormal results are displayed)  Labs Reviewed - No data to display ____________________________________________  EKG   ____________________________________________  RADIOLOGY   No results found.  ____________________________________________    PROCEDURES  Procedure(s) performed:    Procedures    Medications  meloxicam (MOBIC) tablet 15 mg (has no administration in time range)     ____________________________________________   INITIAL IMPRESSION / ASSESSMENT AND PLAN / ED COURSE  Pertinent labs & imaging results that were available during my care of the patient were reviewed by me and considered in my medical decision making (see chart for details).  Review of the [place] CSRS was performed in accordance of the [facility] prior to dispensing any controlled drugs.           Patient's diagnosis is consistent with patellofemoral syndrome.  Patient presents the emergency department with a complaint of right knee pain.  This is in the retropatellar region.  Symptoms are reproduced with pressure over the patella.  No other tenderness to  palpation.  No concerning findings of erythema, edema or ballottement.  Low suspicion for gout, septic arthritis.  No trauma to be concern of for fracture.  No other tenderness to palpation.  Patient will be prescribed meloxicam.  Follow-up with orthopedics if needed. Patient is given ED precautions to return to the ED for any worsening or new symptoms.     ____________________________________________  FINAL CLINICAL IMPRESSION(S) / ED DIAGNOSES  Final diagnoses:  Pain of right patellofemoral joint      NEW MEDICATIONS STARTED DURING THIS VISIT:  ED Discharge Orders         Ordered    meloxicam (MOBIC) 15 MG tablet  Daily     10/05/18 2241              This chart was dictated using voice recognition software/Dragon. Despite best efforts to proofread, errors can occur which can change the meaning. Any change was purely unintentional.    Racheal Patches, PA-C 10/05/18 2241    Phineas Semen, MD 10/05/18 2258

## 2018-10-05 NOTE — ED Triage Notes (Signed)
Pt to triage via w/c with no distress noted; reports right knee pain today with no known injury

## 2018-12-08 DIAGNOSIS — Z6740 Type O blood, Rh positive: Secondary | ICD-10-CM | POA: Insufficient documentation

## 2019-01-03 DIAGNOSIS — S83002A Unspecified subluxation of left patella, initial encounter: Secondary | ICD-10-CM | POA: Insufficient documentation

## 2019-01-10 ENCOUNTER — Other Ambulatory Visit: Payer: Self-pay

## 2019-01-10 DIAGNOSIS — Z20822 Contact with and (suspected) exposure to covid-19: Secondary | ICD-10-CM

## 2019-01-11 LAB — NOVEL CORONAVIRUS, NAA: SARS-CoV-2, NAA: NOT DETECTED

## 2019-04-24 ENCOUNTER — Encounter: Payer: Self-pay | Admitting: *Deleted

## 2019-04-24 ENCOUNTER — Emergency Department
Admission: EM | Admit: 2019-04-24 | Discharge: 2019-04-24 | Disposition: A | Discharge status: Home / Self Care | Payer: BC Managed Care – PPO | Attending: Student in an Organized Health Care Education/Training Program | Admitting: Student in an Organized Health Care Education/Training Program

## 2019-04-24 ENCOUNTER — Other Ambulatory Visit: Payer: Self-pay

## 2019-04-24 DIAGNOSIS — S39012A Strain of muscle, fascia and tendon of lower back, initial encounter: Secondary | ICD-10-CM | POA: Diagnosis not present

## 2019-04-24 DIAGNOSIS — Y9389 Activity, other specified: Secondary | ICD-10-CM | POA: Diagnosis not present

## 2019-04-24 DIAGNOSIS — Y92410 Unspecified street and highway as the place of occurrence of the external cause: Secondary | ICD-10-CM | POA: Diagnosis not present

## 2019-04-24 DIAGNOSIS — Y998 Other external cause status: Secondary | ICD-10-CM | POA: Diagnosis not present

## 2019-04-24 DIAGNOSIS — S3992XA Unspecified injury of lower back, initial encounter: Secondary | ICD-10-CM | POA: Diagnosis present

## 2019-04-24 MED ORDER — LIDOCAINE 5 % EX PTCH: 1.0000 | MEDICATED_PATCH | CUTANEOUS | 0 refills | Status: AC

## 2019-04-24 MED ORDER — CYCLOBENZAPRINE HCL 5 MG PO TABS: 5.0000 mg | ORAL_TABLET | Freq: Three times a day (TID) | ORAL | 0 refills | Status: AC | PRN

## 2019-04-24 MED ORDER — IBUPROFEN 800 MG PO TABS: 800.0000 mg | ORAL_TABLET | Freq: Three times a day (TID) | ORAL | 0 refills | Status: DC | PRN
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
## 2019-04-24 NOTE — ED Notes (Signed)
See triage note  Presents with pain to right side of back and into right leg  States pain started on Thursday  Denies any injury    Describes pain as "electric"

## 2019-04-24 NOTE — ED Triage Notes (Signed)
To ED reporting pain in right upper leg, glute and back. Pain started Thursday and is electric in nature. Standing makes pain worse per pt. NO hx of sciatica.

## 2019-04-24 NOTE — Discharge Instructions (Signed)
Your exam is consistent with lumbar muscle strain. Take the prescription meds as directed. Apply the LidoDerm patches as directed. Follow-up with your provider for ongoing symptoms.

## 2019-05-23 ENCOUNTER — Ambulatory Visit: Payer: No Typology Code available for payment source | Attending: Internal Medicine

## 2019-05-23 DIAGNOSIS — Z20822 Contact with and (suspected) exposure to covid-19: Secondary | ICD-10-CM

## 2019-05-25 LAB — NOVEL CORONAVIRUS, NAA: SARS-CoV-2, NAA: NOT DETECTED

## 2019-07-03 ENCOUNTER — Ambulatory Visit: Payer: No Typology Code available for payment source | Attending: Internal Medicine

## 2019-07-03 DIAGNOSIS — Z20822 Contact with and (suspected) exposure to covid-19: Secondary | ICD-10-CM

## 2019-07-04 LAB — NOVEL CORONAVIRUS, NAA: SARS-CoV-2, NAA: NOT DETECTED

## 2019-09-04 ENCOUNTER — Other Ambulatory Visit: Payer: Self-pay

## 2019-09-04 ENCOUNTER — Emergency Department
Admission: EM | Admit: 2019-09-04 | Discharge: 2019-09-04 | Disposition: A | Discharge status: Home / Self Care | Payer: No Typology Code available for payment source | Attending: Emergency Medicine | Admitting: Emergency Medicine

## 2019-09-04 ENCOUNTER — Encounter: Payer: Self-pay | Admitting: Emergency Medicine

## 2019-09-04 DIAGNOSIS — R040 Epistaxis: Secondary | ICD-10-CM | POA: Diagnosis not present

## 2019-09-04 NOTE — ED Provider Notes (Signed)
[facility] Emergency Department Provider Note ____________________________________________   First MD Initiated Contact with Patient 09/04/19 1604     (approximate)  I have reviewed the triage vital signs and the nursing notes.   HISTORY  Chief Complaint Epistaxis  HPI [name] is a 29 y.o. female presenting to the emergency department for treatment and evaluation of spontaneous epistaxis that started as she was walking out of her job today.  She states that this is happened in the past, but she is typically able to control it fairly easily.  Today she was unable to do so.         History reviewed. No pertinent past medical history.  Patient Active Problem List   Diagnosis Date Noted  . Irregular contractions 04/18/2015  . Postoperative care for cataract 04/18/2015    Past Surgical History:  Procedure Laterality Date  . CESAREAN SECTION    . CESAREAN SECTION N/A   . CESAREAN SECTION N/A 04/18/2015   Procedure: CESAREAN SECTION;  Surgeon: Suzy Bouchard, MD;  Location: [facility] ORS;  Service: Obstetrics;  Laterality: N/A;    Prior to Admission medications   Medication Sig Start Date End Date Taking? Authorizing Provider  docusate sodium (COLACE) 100 MG capsule Take 1 capsule (100 mg total) by mouth 2 (two) times daily as needed. 04/21/15   Christeen Douglas, MD    Allergies Patient has no known allergies.  History reviewed. No pertinent family history.  Social History Social History   Tobacco Use  . Smoking status: Never Smoker  . Smokeless tobacco: Never Used  Substance Use Topics  . Alcohol use: No  . Drug use: No    Review of Systems  Constitutional: No fever/chills Eyes: No visual changes. ENT: Positive for epistaxis. Cardiovascular: Denies chest pain. Respiratory: Denies shortness of breath. Gastrointestinal: No abdominal pain.  No nausea, no vomiting.   Musculoskeletal: Negative for back pain. Skin: Negative  for rash. Neurological: Negative for headaches, focal weakness or numbness. ____________________________________________   PHYSICAL EXAM:  VITAL SIGNS: ED Triage Vitals  Enc Vitals Group     BP 09/04/19 1546 (!) 119/59     Pulse Rate 09/04/19 1546 69     Resp 09/04/19 1551 16     Temp 09/04/19 1546 98.9 F (37.2 C)     Temp Source 09/04/19 1546 Oral     SpO2 09/04/19 1546 100 %     Weight 09/04/19 1547 154 lb (69.9 kg)     Height 09/04/19 1547 5' (1.524 m)     Head Circumference --      Peak Flow --      Pain Score 09/04/19 1550 0     Pain Loc --      Pain Edu? --      Excl. in [place]? --     Constitutional: Alert and oriented. Well appearing and in no acute distress. Eyes: Conjunctivae are normal. PERRL. EOMI. Head: Atraumatic. Nose: No epistaxis.  Evidence of recent epistaxis in the right nostril. Mouth/Throat: Mucous membranes are moist.  Oropharynx non-erythematous. Neck: No stridor.   Hematological/Lymphatic/Immunilogical: No cervical lymphadenopathy. Cardiovascular: Normal rate, regular rhythm. Grossly normal heart sounds.  Good peripheral circulation. Respiratory: Normal respiratory effort.  No retractions. Lungs CTAB. Gastrointestinal: Soft and nontender. No distention. No abdominal bruits. No CVA tenderness. Genitourinary:  Musculoskeletal: No lower extremity tenderness nor edema.  No joint effusions. Neurologic:  Normal speech and language. No gross focal neurologic deficits are appreciated. No gait instability. Skin:  Skin is warm, dry and intact. No rash noted. Psychiatric: Mood and affect are normal. Speech and behavior are normal.  ____________________________________________   LABS (all labs ordered are listed, but only abnormal results are displayed)  Labs Reviewed - No data to display ____________________________________________  EKG  Not indicated ____________________________________________  RADIOLOGY  ED MD interpretation:    Not  indicated  I, Sherrie George, personally viewed and evaluated these images (plain radiographs) as part of my medical decision making, as well as reviewing the written report by the radiologist.  Official radiology report(s): No results found.  ____________________________________________   PROCEDURES  Procedure(s) performed (including Critical Care):  Procedures  ____________________________________________   INITIAL IMPRESSION / ASSESSMENT AND PLAN     29 year old female presenting to the emergency department for treatment and evaluation of acute onset epistaxis.  While in triage, nose clamp was applied.  On initial evaluation, she does not have any bleeding.  No blood noted in the posterior oropharynx and she does not feel that blood is running down her throat.  She has had no nausea or vomiting.  DIFFERENTIAL DIAGNOSIS  Anterior epistaxis, posterior epistaxis, anemia  ED COURSE  No recurrent bleeding while here in the emergency department.  Patient will be sent home with the nose clamp and advised to purchase some Afrin nasal spray.  If the bleed returns, she is to insert 2 sprays and reclamp her nose.  If bleeding has not stopped after 20 minutes, she is to return to the emergency department.  She was advised to follow-up with her primary care provider or see the ENT specialist if the bleeds are recurrent. ____________________________________________   FINAL CLINICAL IMPRESSION(S) / ED DIAGNOSES  Final diagnoses:  Epistaxis     ED Discharge Orders    None       [name] was evaluated in Emergency Department on 09/04/2019 for the symptoms described in the history of present illness. She was evaluated in the context of the global COVID-19 pandemic, which necessitated consideration that the patient might be at risk for infection with the SARS-CoV-2 virus that causes COVID-19. Institutional protocols and algorithms that pertain to the evaluation of patients at risk  for COVID-19 are in a state of rapid change based on information released by regulatory bodies including the CDC and federal and state organizations. These policies and algorithms were followed during the patient's care in the ED.   Note:  This document was prepared using Dragon voice recognition software and may include unintentional dictation errors.   Victorino Dike, FNP 09/04/19 2235    Nance Pear, MD 09/04/19 [phone]

## 2019-09-04 NOTE — Discharge Instructions (Signed)
Use 2 sprays of Afrin nasal spray and clamp if bleeding returns. If bleeding does not stop after 20 minutes, return to the ER.

## 2019-09-04 NOTE — ED Triage Notes (Signed)
Pt here for nose bleed.  Started spontaneously.  Hx of same.  Reports [facility] to [facility] but had 10 ahead of her so came to ED. No pain. Can see where blood was in nare but no active bleeding seen draining.

## 2019-09-04 NOTE — ED Notes (Signed)
See triage note  Presents with nosebleed    Has nasal clamp in place  No active bleeding noted at present    Pt states she needed to go out to lobby and get her charger.Marland Kitchen

## 2020-01-10 ENCOUNTER — Emergency Department
Admission: EM | Admit: 2020-01-10 | Discharge: 2020-01-10 | Disposition: A | Discharge status: Home / Self Care | Payer: No Typology Code available for payment source | Attending: Emergency Medicine | Admitting: Emergency Medicine

## 2020-01-10 ENCOUNTER — Other Ambulatory Visit: Payer: Self-pay

## 2020-01-10 DIAGNOSIS — Y999 Unspecified external cause status: Secondary | ICD-10-CM | POA: Insufficient documentation

## 2020-01-10 DIAGNOSIS — X58XXXA Exposure to other specified factors, initial encounter: Secondary | ICD-10-CM | POA: Insufficient documentation

## 2020-01-10 DIAGNOSIS — Z20822 Contact with and (suspected) exposure to covid-19: Secondary | ICD-10-CM | POA: Insufficient documentation

## 2020-01-10 DIAGNOSIS — Y939 Activity, unspecified: Secondary | ICD-10-CM | POA: Insufficient documentation

## 2020-01-10 DIAGNOSIS — S80861A Insect bite (nonvenomous), right lower leg, initial encounter: Secondary | ICD-10-CM | POA: Insufficient documentation

## 2020-01-10 DIAGNOSIS — N390 Urinary tract infection, site not specified: Secondary | ICD-10-CM | POA: Insufficient documentation

## 2020-01-10 DIAGNOSIS — Y929 Unspecified place or not applicable: Secondary | ICD-10-CM | POA: Insufficient documentation

## 2020-01-10 LAB — CBC WITH DIFFERENTIAL/PLATELET
Abs Immature Granulocytes: 0.03 10*3/uL (ref 0.00–0.07)
Basophils Absolute: 0 10*3/uL (ref 0.0–0.1)
Basophils Relative: 0 %
Eosinophils Absolute: 0.2 10*3/uL (ref 0.0–0.5)
Eosinophils Relative: 3 %
HCT: 37.5 % (ref 36.0–46.0)
Hemoglobin: 12.4 g/dL (ref 12.0–15.0)
Immature Granulocytes: 0 %
Lymphocytes Relative: 26 %
Lymphs Abs: 2.1 10*3/uL (ref 0.7–4.0)
MCH: 31.4 pg (ref 26.0–34.0)
MCHC: 33.1 g/dL (ref 30.0–36.0)
MCV: 94.9 fL (ref 80.0–100.0)
Monocytes Absolute: 0.6 10*3/uL (ref 0.1–1.0)
Monocytes Relative: 8 %
Neutro Abs: 5.2 10*3/uL (ref 1.7–7.7)
Neutrophils Relative %: 63 %
Platelets: 260 10*3/uL (ref 150–400)
RBC: 3.95 MIL/uL (ref 3.87–5.11)
RDW: 11.3 % — ABNORMAL LOW (ref 11.5–15.5)
WBC: 8.2 10*3/uL (ref 4.0–10.5)
nRBC: 0 % (ref 0.0–0.2)

## 2020-01-10 LAB — URINALYSIS, COMPLETE (UACMP) WITH MICROSCOPIC
Bilirubin Urine: NEGATIVE
Glucose, UA: NEGATIVE mg/dL
Ketones, ur: NEGATIVE mg/dL
Nitrite: NEGATIVE
Protein, ur: NEGATIVE mg/dL
Specific Gravity, Urine: 1.011 (ref 1.005–1.030)
pH: 7 (ref 5.0–8.0)

## 2020-01-10 LAB — COMPREHENSIVE METABOLIC PANEL
ALT: 15 U/L (ref 0–44)
AST: 16 U/L (ref 15–41)
Albumin: 4 g/dL (ref 3.5–5.0)
Alkaline Phosphatase: 75 U/L (ref 38–126)
Anion gap: 9 (ref 5–15)
BUN: 14 mg/dL (ref 6–20)
CO2: 24 mmol/L (ref 22–32)
Calcium: 8.6 mg/dL — ABNORMAL LOW (ref 8.9–10.3)
Chloride: 107 mmol/L (ref 98–111)
Creatinine, Ser: 0.8 mg/dL (ref 0.44–1.00)
GFR calc Af Amer: >60 mL/min (ref >60)
GFR calc non Af Amer: >60 mL/min (ref >60)
Glucose, Bld: 100 mg/dL — ABNORMAL HIGH (ref 70–99)
Potassium: 3.8 mmol/L (ref 3.5–5.1)
Sodium: 140 mmol/L (ref 135–145)
Total Bilirubin: 0.9 mg/dL (ref 0.3–1.2)
Total Protein: 7.2 g/dL (ref 6.5–8.1)

## 2020-01-10 LAB — GROUP A STREP BY PCR: Group A Strep by PCR: NOT DETECTED

## 2020-01-10 LAB — POCT PREGNANCY, URINE: Preg Test, Ur: NEGATIVE

## 2020-01-10 LAB — SARS CORONAVIRUS 2 BY RT PCR (HOSPITAL ORDER, PERFORMED IN ~~LOC~~ HOSPITAL LAB): SARS Coronavirus 2: NEGATIVE

## 2020-01-10 MED ORDER — CEPHALEXIN 500 MG PO CAPS: 500.0000 mg | ORAL_CAPSULE | Freq: Three times a day (TID) | ORAL | 0 refills | Status: DC

## 2020-01-10 NOTE — Discharge Instructions (Signed)
Call make an appointment with your primary care provider for further evaluation of your bruises.  You also have a urinary tract infection and antibiotics are being sent to your pharmacy to treat this.  Continue the full course of antibiotics until completely finished.  Also increase fluids.  You may take Tylenol with this medication if needed.

## 2020-01-10 NOTE — ED Triage Notes (Signed)
Pt comes POV with insect bite on right leg. Pt also starting to have sore throat with some swelling and a cough. Speaking in full sentences.

## 2020-01-10 NOTE — ED Provider Notes (Signed)
[facility] Emergency Department Provider Note  ____________________________________________   First MD Initiated Contact with Patient 01/10/20 1448     (approximate)  I have reviewed the triage vital signs and the nursing notes.   HISTORY  Chief Complaint Sore Throat and Insect Bite    HPI [name] is a 29 y.o. female presents to the ED with complaint of insect bite to her right leg. Patient states that the area on her right leg has gotten red.  She also is concerned about some bruising that she has as well.  She states that she has had a cough and sore throat but denies any changes in taste or smell, vomiting or diarrhea.  She is unaware of any exposure to Covid.  She rates her pain as a 1/10.      History reviewed. No pertinent past medical history.  Patient Active Problem List   Diagnosis Date Noted  . Irregular contractions 04/18/2015  . Postoperative care for cataract 04/18/2015    Past Surgical History:  Procedure Laterality Date  . CESAREAN SECTION    . CESAREAN SECTION N/A   . CESAREAN SECTION N/A 04/18/2015   Procedure: CESAREAN SECTION;  Surgeon: Suzy Bouchard, MD;  Location: [facility] ORS;  Service: Obstetrics;  Laterality: N/A;    Prior to Admission medications   Medication Sig Start Date End Date Taking? Authorizing Provider  cephALEXin (KEFLEX) 500 MG capsule Take 1 capsule (500 mg total) by mouth 3 (three) times daily. 01/10/20   Tommi Rumps, PA-C  docusate sodium (COLACE) 100 MG capsule Take 1 capsule (100 mg total) by mouth 2 (two) times daily as needed. 04/21/15   Christeen Douglas, MD    Allergies Patient has no known allergies.  History reviewed. No pertinent family history.  Social History Social History   Tobacco Use  . Smoking status: Never Smoker  . Smokeless tobacco: Never Used  Substance Use Topics  . Alcohol use: No  . Drug use: No    Review of Systems Constitutional: No fever/chills Eyes:  No visual changes. ENT: Positive sore throat. Cardiovascular: Denies chest pain. Respiratory: Denies shortness of breath.  Positive cough. Gastrointestinal: No abdominal pain.  No nausea, no vomiting.  Genitourinary: Negative for dysuria. Musculoskeletal: Negative for muscle and joint pain. Skin: Positive for rash and bruises. Neurological: Negative for headaches, focal weakness or numbness. ____________________________________________   PHYSICAL EXAM:  VITAL SIGNS: ED Triage Vitals [01/10/20 1258]  Enc Vitals Group     BP 106/71     Pulse Rate 81     Resp 18     Temp 98.9 F (37.2 C)     Temp Source Oral     SpO2 96 %     Weight 150 lb (68 kg)     Height 4\' 11"  (1.499 m)     Head Circumference      Peak Flow      Pain Score 1     Pain Loc      Pain Edu?      Excl. in [place]?     Constitutional: Alert and oriented. Well appearing and in no acute distress. Eyes: Conjunctivae are normal.  Head: Atraumatic. Nose: No congestion/rhinnorhea. Mouth/Throat: Mucous membranes are moist.  Oropharynx non-erythematous. Neck: No stridor.   Hematological/Lymphatic/Immunilogical: No cervical lymphadenopathy. Cardiovascular: Normal rate, regular rhythm. Grossly normal heart sounds.  Good peripheral circulation. Respiratory: Normal respiratory effort.  No retractions. Lungs CTAB. Musculoskeletal: On examination of the right lower extremity there  is no gross deformity and no pitting edema.  Patient does have an erythematous area with centralized probable bite areas without drainage.  Area is slightly warm to touch.  Patient is able to move and bear weight on her right leg without any difficulty normal gait was noted.  Motor sensory function intact.  Pulses present. Neurologic:  Normal speech and language. No gross focal neurologic deficits are appreciated.  Skin:  Skin is warm, dry.  Erythematous insect bite area as described above.  Also there are individual diffuse bruises to the lower  extremities in various stages of healing.  Skin is intact over these areas. Psychiatric: Mood and affect are normal. Speech and behavior are normal.  ____________________________________________   LABS (all labs ordered are listed, but only abnormal results are displayed)  Labs Reviewed  CBC WITH DIFFERENTIAL/PLATELET - Abnormal; Notable for the following components:      Result Value   RDW 11.3 (*)    All other components within normal limits  COMPREHENSIVE METABOLIC PANEL - Abnormal; Notable for the following components:   Glucose, Bld 100 (*)    Calcium 8.6 (*)    All other components within normal limits  URINALYSIS, COMPLETE (UACMP) WITH MICROSCOPIC - Abnormal; Notable for the following components:   Color, Urine STRAW (*)    APPearance HAZY (*)    Hgb urine dipstick SMALL (*)    Leukocytes,Ua LARGE (*)    Bacteria, UA RARE (*)    All other components within normal limits  GROUP A STREP BY PCR  SARS CORONAVIRUS 2 BY RT PCR (HOSPITAL ORDER, PERFORMED IN [facility])  POCT PREGNANCY, URINE  POC URINE PREG, ED    PROCEDURES  Procedure(s) performed (including Critical Care):  Procedures   ____________________________________________   INITIAL IMPRESSION / ASSESSMENT AND PLAN / ED COURSE  As part of my medical decision making, I reviewed the following data within the electronic MEDICAL RECORD NUMBER Notes from prior ED visits and [place] Controlled Substance Database  29 year old female presents to the ED with complaint of insect bite to her right leg that is getting red and sore.  She also complained of a sore throat along with a cough.  Strep test was negative and lab work was reassuring.  Patient urinalysis does show that she has a urinary tract infection she was made aware.  At this time there is no explanation for her bruising.  At the time of discharge her Covid test was still pending but she is aware that she can check the results on my chart.  A prescription for  Keflex was sent to her pharmacy with instructions to take until completely finished.  She also was encouraged to call make an appointment with her primary care provider for further evaluation of her bruises and recheck of her urine.  ____________________________________________   FINAL CLINICAL IMPRESSION(S) / ED DIAGNOSES  Final diagnoses:  Acute urinary tract infection  Insect bite of right lower leg, initial encounter     ED Discharge Orders         Ordered    cephALEXin (KEFLEX) 500 MG capsule  3 times daily     Discontinue  Reprint     01/10/20 1810           Note:  This document was prepared using Dragon voice recognition software and may include unintentional dictation errors.    Tommi Rumps, PA-C 01/10/20 1818    Arnaldo Natal, MD 01/10/20 2035

## 2020-01-14 ENCOUNTER — Emergency Department: Payer: Self-pay

## 2020-01-14 ENCOUNTER — Encounter: Payer: Self-pay | Admitting: Emergency Medicine

## 2020-01-14 ENCOUNTER — Other Ambulatory Visit: Payer: Self-pay

## 2020-01-14 DIAGNOSIS — Z20822 Contact with and (suspected) exposure to covid-19: Secondary | ICD-10-CM | POA: Insufficient documentation

## 2020-01-14 DIAGNOSIS — I83812 Varicose veins of left lower extremities with pain: Secondary | ICD-10-CM | POA: Insufficient documentation

## 2020-01-14 DIAGNOSIS — M79605 Pain in left leg: Secondary | ICD-10-CM | POA: Insufficient documentation

## 2020-01-14 NOTE — ED Triage Notes (Signed)
Patient states that she is concerned that she has a blood clot in her left leg. Patient states that a "knot" came on her leg yesterday and then today changed to purple area with some swelling. Patient denies any injury. Patient states that she also has a cough two days ago. Patient states that she was tested for covid two days ago that was negative.

## 2020-01-15 ENCOUNTER — Emergency Department
Admission: EM | Admit: 2020-01-15 | Discharge: 2020-01-15 | Disposition: A | Discharge status: Home / Self Care | Payer: Self-pay | Attending: Emergency Medicine | Admitting: Emergency Medicine

## 2020-01-15 DIAGNOSIS — I8392 Asymptomatic varicose veins of left lower extremity: Secondary | ICD-10-CM

## 2020-01-15 DIAGNOSIS — M79605 Pain in left leg: Secondary | ICD-10-CM

## 2020-01-15 LAB — SARS CORONAVIRUS 2 (TAT 6-24 HRS): SARS Coronavirus 2: NEGATIVE

## 2020-01-15 NOTE — ED Provider Notes (Signed)
[facility] Emergency Department Provider Note   ____________________________________________   First MD Initiated Contact with Patient 01/15/20 0507     (approximate)  I have reviewed the triage vital signs and the nursing notes.   HISTORY  Chief Complaint Leg Pain    HPI [name] is a 29 y.o. female with no significant past medical history who presents to the ED complaining of leg pain.  Patient reports that she noticed a knot over lateral portion of her left calf yesterday.  She since developed increasing pain as well as bruising over the same area where she felt the knot.  She denies any specific trauma to this area but was concerned her symptoms could be due to a clot in her leg.  She denies any history of DVT/PE, does not take any hormonal medications, and has not had any recent long trips.  She denies any chest pain or shortness of breath.        History reviewed. No pertinent past medical history.  Patient Active Problem List   Diagnosis Date Noted   Irregular contractions 04/18/2015   Postoperative care for cataract 04/18/2015    Past Surgical History:  Procedure Laterality Date   CESAREAN SECTION     CESAREAN SECTION N/A    CESAREAN SECTION N/A 04/18/2015   Procedure: CESAREAN SECTION;  Surgeon: Suzy Bouchard, MD;  Location: [facility] ORS;  Service: Obstetrics;  Laterality: N/A;    Prior to Admission medications   Medication Sig Start Date End Date Taking? Authorizing Provider  cephALEXin (KEFLEX) 500 MG capsule Take 1 capsule (500 mg total) by mouth 3 (three) times daily. 01/10/20   Tommi Rumps, PA-C  docusate sodium (COLACE) 100 MG capsule Take 1 capsule (100 mg total) by mouth 2 (two) times daily as needed. 04/21/15   Christeen Douglas, MD    Allergies Patient has no known allergies.  No family history on file.  Social History Social History   Tobacco Use   Smoking status: Never Smoker   Smokeless  tobacco: Never Used  Substance Use Topics   Alcohol use: No   Drug use: No    Review of Systems  Constitutional: No fever/chills Eyes: No visual changes. ENT: No sore throat. Cardiovascular: Denies chest pain. Respiratory: Denies shortness of breath. Gastrointestinal: No abdominal pain.  No nausea, no vomiting.  No diarrhea.  No constipation. Genitourinary: Negative for dysuria. Musculoskeletal: Negative for back pain.  Positive for left leg pain and swelling. Skin: Negative for rash. Neurological: Negative for headaches, focal weakness or numbness.  ____________________________________________   PHYSICAL EXAM:  VITAL SIGNS: ED Triage Vitals  Enc Vitals Group     BP 01/14/20 2129 124/71     Pulse Rate 01/14/20 2129 82     Resp 01/14/20 2129 (!) 22     Temp 01/14/20 2129 99.2 F (37.3 C)     Temp Source 01/14/20 2129 Oral     SpO2 01/14/20 2129 99 %     Weight 01/14/20 2129 150 lb (68 kg)     Height 01/14/20 2145 5' (1.524 m)     Head Circumference --      Peak Flow --      Pain Score 01/14/20 2145 8     Pain Loc --      Pain Edu? --      Excl. in GC? --     Constitutional: Alert and oriented. Eyes: Conjunctivae are normal. Head: Atraumatic. Nose: No congestion/rhinnorhea. Mouth/Throat: Mucous  membranes are moist. Neck: Normal ROM Cardiovascular: Normal rate, regular rhythm. Grossly normal heart sounds.  2+ DP pulses bilaterally. Respiratory: Normal respiratory effort.  No retractions. Lungs CTAB. Gastrointestinal: Soft and nontender. No distention. Genitourinary: deferred Musculoskeletal: Mild tenderness to left lateral portion of left calf with associated ecchymosis, no erythema or warmth. Neurologic:  Normal speech and language. No gross focal neurologic deficits are appreciated. Skin:  Skin is warm, dry and intact. No rash noted. Psychiatric: Mood and affect are normal. Speech and behavior are normal.  ____________________________________________     LABS (all labs ordered are listed, but only abnormal results are displayed)  Labs Reviewed  SARS CORONAVIRUS 2 (TAT 6-24 HRS)     PROCEDURES  Procedure(s) performed (including Critical Care):  Procedures   ____________________________________________   INITIAL IMPRESSION / ASSESSMENT AND PLAN / ED COURSE       29 year old female with no significant past medical history presents to the ED complaining of left lower extremity pain and swelling with associated ecchymosis since yesterday.  Ultrasound was performed and negative for DVT, she denies any symptoms concerning for PE.  There is no signs of infection and she is neurovascularly intact to her left lower extremity.  There does appear to be venous varicosity associated with the area of pain and ecchymosis, no active bleeding noted.  I suspect her symptoms are due to varicose veins and she is appropriate for discharge home with vascular follow-up.  She was counseled to use compression stockings during the day and to return to the ED for new or worsening symptoms, patient agrees with plan.      ____________________________________________   FINAL CLINICAL IMPRESSION(S) / ED DIAGNOSES  Final diagnoses:  Left leg pain  Varicose veins of left lower extremity, unspecified whether complicated     ED Discharge Orders    None       Note:  This document was prepared using Dragon voice recognition software and may include unintentional dictation errors.   Chesley Noon, MD 01/15/20 [phone]

## 2020-01-26 ENCOUNTER — Other Ambulatory Visit: Payer: Self-pay

## 2020-01-26 ENCOUNTER — Encounter (INDEPENDENT_AMBULATORY_CARE_PROVIDER_SITE_OTHER): Payer: Self-pay | Admitting: Vascular Surgery

## 2020-01-26 ENCOUNTER — Ambulatory Visit (INDEPENDENT_AMBULATORY_CARE_PROVIDER_SITE_OTHER): Payer: PRIVATE HEALTH INSURANCE | Admitting: Vascular Surgery

## 2020-01-26 DIAGNOSIS — I83813 Varicose veins of bilateral lower extremities with pain: Secondary | ICD-10-CM | POA: Diagnosis not present

## 2020-01-26 NOTE — Progress Notes (Signed)
[name], female   DOB: [birth year], 29 y.o.   MRN: [id number]  Chief Complaint  Patient presents with  . Follow-up    [facility] post ED follow up    [name] is a 29 y.o. female.  I am asked to see the patient by Dr. Larinda Buttery at the [facility] ER for evaluation of varicose veins with leg pain.  She went to the emergency room earlier this month with a large knot that turned into a bruise on her left lateral posterior calf.  This is associated an area of prominent varicosities.  She has had worsening leg pain and swelling over several months to years.  No previous history of DVT or superficial thrombophlebitis to her knowledge.  Both legs are affected.  She says she has had these bruises, many times but nothing is vague as this one.  She has recently begun wearing compression stockings which has had a mild improvement in the pain and swelling in her legs.  She is also trying to elevate her legs and has been walking more.   No past medical history on file.  Past Surgical History:  Procedure Laterality Date  . CESAREAN SECTION    . CESAREAN SECTION N/A   . CESAREAN SECTION N/A 04/18/2015   Procedure: CESAREAN SECTION;  Surgeon: Suzy Bouchard, MD;  Location: [facility] ORS;  Service: Obstetrics;  Laterality: N/A;     Family History  Adopted: Yes  Problem Relation Age of Onset  . Alcohol abuse Mother   . Thyroid disease Other   no known bleeding or clotting disorders   Social History   Tobacco Use  . Smoking status: Never Smoker  . Smokeless tobacco: Never Used  Substance Use Topics  . Alcohol use: No  . Drug use: No     No Known Allergies  Current Outpatient Medications  Medication Sig Dispense Refill  . cephALEXin (KEFLEX) 500 MG capsule Take 1 capsule (500 mg total) by mouth 3 (three) times daily. 21 capsule 0  . docusate sodium (COLACE) 100 MG capsule Take 1 capsule (100 mg total) by mouth 2 (two) times daily as needed. 30 capsule 2   No current  facility-administered medications for this visit.      REVIEW OF SYSTEMS (Negative unless checked)  Constitutional: [] Weight loss  [] Fever  [] Chills Cardiac: [] Chest pain   [] Chest pressure   [] Palpitations   [] Shortness of breath when laying flat   [] Shortness of breath at rest   [] Shortness of breath with exertion. Vascular:  [x] Pain in legs with walking   [] Pain in legs at rest   [] Pain in legs when laying flat   [] Claudication   [] Pain in feet when walking  [] Pain in feet at rest  [] Pain in feet when laying flat   [] History of DVT   [] Phlebitis   [x] Swelling in legs   [x] Varicose veins   [] Non-healing ulcers Pulmonary:   [] Uses home oxygen   [] Productive cough   [] Hemoptysis   [] Wheeze  [] COPD   [] Asthma Neurologic:  [] Dizziness  [] Blackouts   [] Seizures   [] History of stroke   [] History of TIA  [] Aphasia   [] Temporary blindness   [] Dysphagia   [] Weakness or numbness in arms   [] Weakness or numbness in legs Musculoskeletal:  [] Arthritis   [] Joint swelling   [] Joint pain   [] Low back pain Hematologic:  [] Easy bruising  [] Easy bleeding   [] Hypercoagulable state   [] Anemic  [] Hepatitis Gastrointestinal:  [] Blood in  stool   [] Vomiting blood  [] Gastroesophageal reflux/heartburn   [] Abdominal pain Genitourinary:  [] Chronic kidney disease   [] Difficult urination  [] Frequent urination  [] Burning with urination   [] Hematuria Skin:  [] Rashes   [] Ulcers   [] Wounds Psychological:  [] History of anxiety   []  History of major depression.    Physical Exam BP 125/78 (BP Location: Right Arm)   Pulse 73   Resp 16   Wt 152 lb 9.6 oz (69.2 kg)   BMI 29.80 kg/m  Gen:  WD/WN, NAD Head: [place]/AT, No temporalis wasting.  Ear/Nose/Throat: Hearing grossly intact, nares w/o erythema or drainage, oropharynx w/o Erythema/Exudate Eyes: Conjunctiva clear, sclera non-icteric  Neck: trachea midline.  No JVD.  Pulmonary:  Good air movement, respirations not labored, no use of accessory muscles  Cardiac: RRR, no  JVD Vascular: Diffuse varicosities present bilaterally. Vessel Right Left  Radial Palpable Palpable                          DP  2+  2+  PT  2+  2+   Gastrointestinal:. No masses, surgical incisions, or scars. Musculoskeletal: M/S 5/5 throughout.  Extremities without ischemic changes.  No deformity or atrophy.  Trace bilateral lower extremity edema. Neurologic: Sensation grossly intact in extremities.  Symmetrical.  Speech is fluent. Motor exam as listed above. Psychiatric: Judgment intact, Mood & affect appropriate for pt's clinical situation. Dermatologic: No rashes or ulcers noted.  No cellulitis or open wounds.    Radiology Venous Img Lower Unilateral Left  Result Date: 01/14/2020 CLINICAL DATA:  Left leg swelling for 1 day. EXAM: LEFT LOWER EXTREMITY VENOUS DOPPLER ULTRASOUND TECHNIQUE: Gray-scale sonography with compression, as well as color and duplex ultrasound, were performed to evaluate the deep venous system(s) from the level of the common femoral vein through the popliteal and proximal calf veins. COMPARISON:  None. FINDINGS: VENOUS Normal compressibility of the common femoral, superficial femoral, and popliteal veins, as well as the visualized calf veins. Visualized portions of profunda femoral vein and great saphenous vein unremarkable. No filling defects to suggest DVT on grayscale or color Doppler imaging. Doppler waveforms show normal direction of venous flow, normal respiratory plasticity and response to augmentation. Limited views of the contralateral common femoral vein are unremarkable. OTHER In the area of bruising labeled left lateral calf below the knee there are subcutaneous varicosities without thrombosis. Limitations: none IMPRESSION: 1. No evidence of left lower extremity DVT. 2. Subcutaneous non thrombosed varicosities in the area of bruising in the left lateral calf. Electronically Signed   By: M.D.   On: 01/14/2020 23:03    Labs Recent  Results (from the past 2160 hour(s))  CBC with Differential     Status: Abnormal   Collection Time: 01/10/20  4:02 PM  Result Value Ref Range   WBC 8.2 4.0 - 10.5 K/uL   RBC 3.95 3.87 - 5.11 MIL/uL   Hemoglobin 12.4 12.0 - 15.0 g/dL   HCT 36 - 46 %   MCV 94.9 80.0 - 100.0 fL   MCH 31.4 26.0 - 34.0 pg   MCHC 33.1 30.0 - 36.0 g/dL   RDW (L) - %   Platelets 260 150 - 400 K/uL   nRBC 0.0 0.0 - 0.2 %   Neutrophils Relative % 63 %   Neutro Abs 5.2 1.7 - 7.7 K/uL   Lymphocytes Relative 26 %   Lymphs Abs 2.1 0.7 - 4.0 K/uL  Monocytes Relative 8 %   Monocytes Absolute 0.6 0 - 1 K/uL   Eosinophils Relative 3 %   Eosinophils Absolute 0.2 0 - 0 K/uL   Basophils Relative 0 %   Basophils Absolute 0.0 0 - 0 K/uL   Immature Granulocytes 0 %   Abs Immature Granulocytes 0.03 0.00 - 0.07 K/uL    Comment: Performed at [facility], [address] [place], [place] [address]  Comprehensive metabolic panel     Status: Abnormal   Collection Time: 01/10/20  4:02 PM  Result Value Ref Range   Sodium 140 135 - 145 mmol/L   Potassium 3.8 3.5 - 5.1 mmol/L   Chloride 107 98 - 111 mmol/L   CO2 24 22 - 32 mmol/L   Glucose, Bld 100 (H) 70 - 99 mg/dL    Comment: Glucose reference range applies only to samples taken after fasting for at least 8 hours.   BUN 14 6 - 20 mg/dL   Creatinine, Ser 6.040.80 0.44 - 1.00 mg/dL   Calcium 8.6 (L) 8.9 - 10.3 mg/dL   Total Protein 7.2 6.5 - 8.1 g/dL   Albumin 4.0 3.5 - 5.0 g/dL   AST 16 15 - 41 U/L   ALT 15 0 - 44 U/L   Alkaline Phosphatase 75 38 - 126 U/L   Total Bilirubin 0.9 0.3 - 1.2 mg/dL   GFR calc non Af Amer >60 >60 mL/min   GFR calc Af Amer >60 >60 mL/min   Anion gap 9 5 - 15    Comment: Performed at [facility], [address] [place], [place] [address]  Urinalysis, Complete w Microscopic     Status: Abnormal   Collection Time: 01/10/20  4:02 PM  Result Value Ref Range   Color, Urine STRAW (A) YELLOW   APPearance  HAZY (A) CLEAR   Specific Gravity, Urine 1.011 1.005 - 1.030   pH 7.0 5.0 - 8.0   Glucose, UA NEGATIVE NEGATIVE mg/dL   Hgb urine dipstick SMALL (A) NEGATIVE   Bilirubin Urine NEGATIVE NEGATIVE   Ketones, ur NEGATIVE NEGATIVE mg/dL   Protein, ur NEGATIVE NEGATIVE mg/dL   Nitrite NEGATIVE NEGATIVE   Leukocytes,Ua LARGE (A) NEGATIVE   RBC / HPF 0-5 0 - 5 RBC/hpf   WBC, UA 6-10 0 - 5 WBC/hpf   Bacteria, UA RARE (A) NONE SEEN   Squamous Epithelial / LPF 6-10 0 - 5   Mucus PRESENT     Comment: Performed at [facility], [address] [place], [place] [address]  SARS Coronavirus 2 by RT PCR (hospital order, performed in [facility] hospital lab) Nasopharyngeal Throat     Status: None   Collection Time: 01/10/20  4:04 PM   Specimen: Throat; Nasopharyngeal  Result Value Ref Range   SARS Coronavirus 2 NEGATIVE NEGATIVE    Comment: (NOTE) SARS-CoV-2 target nucleic acids are NOT DETECTED.  The SARS-CoV-2 RNA is generally detectable in upper and lower respiratory specimens during the acute phase of infection. The lowest concentration of SARS-CoV-2 viral copies this assay can detect is 250 copies / mL. A negative result does not preclude SARS-CoV-2 infection and should not be used as the sole basis for treatment or other patient management decisions.  A negative result may occur with improper specimen collection / handling, submission of specimen other than nasopharyngeal swab, presence of viral mutation(s) within the areas targeted by this assay, and inadequate number of viral copies (<250 copies / mL). A negative result must  be combined with clinical observations, patient history, and epidemiological information.  Fact Sheet for Patients:   BoilerBrush.com.cy  Fact Sheet for Healthcare Providers: https://pope.com/  This test is not yet approved or  cleared by the [place] FDA and has been authorized for detection and/or  diagnosis of SARS-CoV-2 by FDA under an Emergency Use Authorization (EUA).  This EUA will remain in effect (meaning this test can be used) for the duration of the COVID-19 declaration under Section 564(b)(1) of the Act, 21 U.S.C. section 360bbb-3(b)(1), unless the authorization is terminated or revoked sooner.  Performed at [facility], [address] [place], [place] [address]   Group A Strep by PCR     Status: None   Collection Time: 01/10/20  4:06 PM   Specimen: Throat; Sterile Swab  Result Value Ref Range   Group A Strep by PCR NOT DETECTED NOT DETECTED    Comment: Performed at [facility], [address] [place], [place] [address]  Pregnancy, urine POC     Status: None   Collection Time: 01/10/20  5:24 PM  Result Value Ref Range   Preg Test, Ur NEGATIVE NEGATIVE    Comment:        THE SENSITIVITY OF THIS METHODOLOGY IS >24 mIU/mL   SARS CORONAVIRUS 2 (TAT 6-24 HRS) Nasopharyngeal Nasopharyngeal Swab     Status: None   Collection Time: 01/15/20  5:35 AM   Specimen: Nasopharyngeal Swab  Result Value Ref Range   SARS Coronavirus 2 NEGATIVE NEGATIVE    Comment: (NOTE) SARS-CoV-2 target nucleic acids are NOT DETECTED.  The SARS-CoV-2 RNA is generally detectable in upper and lower respiratory specimens during the acute phase of infection. Negative results do not preclude SARS-CoV-2 infection, do not rule out co-infections with other pathogens, and should not be used as the sole basis for treatment or other patient management decisions. Negative results must be combined with clinical observations, patient history, and epidemiological information. The expected result is Negative.  Fact Sheet for Patients: HairSlick.no  Fact Sheet for Healthcare Providers: quierodirigir.com  This test is not yet approved or cleared by the [place] FDA and  has been authorized for detection and/or diagnosis of  SARS-CoV-2 by FDA under an Emergency Use Authorization (EUA). This EUA will remain  in effect (meaning this test can be used) for the duration of the COVID-19 declaration under Se ction 564(b)(1) of the Act, 21 U.S.C. section 360bbb-3(b)(1), unless the authorization is terminated or revoked sooner.  Performed at [facility], [address], [place], [place] [address]     Assessment/Plan:  Varicose veins of leg with pain, bilateral  Recommend:  The patient has large symptomatic varicose veins that are painful and associated with swelling.  I have had a long discussion with the patient regarding  varicose veins and why they cause symptoms.  Patient will begin wearing graduated compression stockings class 1 on a daily basis, beginning first thing in the morning and removing them in the evening. The patient is instructed specifically not to sleep in the stockings.    The patient  will also begin using over-the-counter analgesics such as Motrin 600 mg po TID to help control the symptoms.    In addition, behavioral modification including elevation during the day will be initiated.    Pending the results of these changes the  patient will be reevaluated in three months.   An  ultrasound of the venous system will be obtained.   Further plans will  be based on the ultrasound results and whether conservative therapies are successful at eliminating the pain and swelling.       Festus Barren 01/26/2020, 2:12 PM   This note was created with Dragon medical transcription system.  Any errors from dictation are unintentional.

## 2020-01-26 NOTE — Assessment & Plan Note (Signed)
Recommend:  The patient has large symptomatic varicose veins that are painful and associated with swelling.  I have had a long discussion with the patient regarding  varicose veins and why they cause symptoms.  Patient will begin wearing graduated compression stockings class 1 on a daily basis, beginning first thing in the morning and removing them in the evening. The patient is instructed specifically not to sleep in the stockings.    The patient  will also begin using over-the-counter analgesics such as Motrin 600 mg po TID to help control the symptoms.    In addition, behavioral modification including elevation during the day will be initiated.    Pending the results of these changes the  patient will be reevaluated in three months.   An  ultrasound of the venous system will be obtained.   Further plans will be based on the ultrasound results and whether conservative therapies are successful at eliminating the pain and swelling.  

## 2020-01-26 NOTE — Patient Instructions (Signed)
Varicose Veins Varicose veins are veins that have become enlarged, bulged, and twisted. They most often appear in the legs. What are the causes? This condition is caused by damage to the valves in the vein. These valves help blood return to your heart. When they are damaged and they stop working properly, blood may flow backward and back up in the veins near the skin, causing the veins to get larger and appear twisted. The condition can result from any issue that causes blood to back up, like pregnancy, prolonged standing, or obesity. What increases the risk? This condition is more likely to develop in people who are:  On their feet a lot.  Pregnant.  Overweight. What are the signs or symptoms? Symptoms of this condition include:  Bulging, twisted, and bluish veins.  A feeling of heaviness. This may be worse at the end of the day.  Leg pain. This may be worse at the end of the day.  Swelling in the leg.  Changes in skin color over the veins. How is this diagnosed? This condition may be diagnosed based on your symptoms, a physical exam, and an ultrasound test. How is this treated? Treatment for this condition may involve:  Avoiding sitting or standing in one position for long periods of time.  Wearing compression stockings. These stockings help to prevent blood clots and reduce swelling in the legs.  Raising (elevating) the legs when resting.  Losing weight.  Exercising regularly. If you have persistent symptoms or want to improve the way your varicose veins look, you may choose to have a procedure to close the varicose veins off or to remove them. Treatments to close off the veins include:  Sclerotherapy. In this treatment, a solution is injected into a vein to close it off.  Laser treatment. In this treatment, the vein is heated with a laser to close it off.  Radiofrequency vein ablation. In this treatment, an electrical current produced by radio waves is used to close  off the vein. Treatments to remove the veins include:  Phlebectomy. In this treatment, the veins are removed through small incisions made over the veins.  Vein ligation and stripping. In this treatment, incisions are made over the veins. The veins are then removed after being tied (ligated) with stitches (sutures). Follow these instructions at home: Activity  Walk as much as possible. Walking increases blood flow. This helps blood return to the heart and takes pressure off your veins. It also increases your cardiovascular strength.  Follow your health care provider's instructions about exercising.  Do not stand or sit in one position for a long period of time.  Do not sit with your legs crossed.  Rest with your legs raised during the day. General instructions   Follow any diet instructions given to you by your health care provider.  Wear compression stockings as directed by your health care provider. Do not wear other kinds of tight clothing around your legs, pelvis, or waist.  Elevate your legs at night to above the level of your heart.  If you get a cut in the skin over the varicose vein and the vein bleeds: ? Lie down with your leg raised. ? Apply firm pressure to the cut with a clean cloth until the bleeding stops. ? Place a bandage (dressing) on the cut. Contact a health care provider if:  The skin around your varicose veins starts to break down.  You have pain, redness, tenderness, or hard swelling over a vein.  You   are uncomfortable because of pain.  You get a cut in the skin over a varicose vein and it will not stop bleeding. Summary  Varicose veins are veins that have become enlarged, bulged, and twisted. They most often appear in the legs.  This condition is caused by damage to the valves in the vein. These valves help blood return to your heart.  Treatment for this condition includes frequent movements, wearing compression stockings, losing weight, and  exercising regularly. In some cases, procedures are done to close off or remove the veins.  Treatment for this condition may include wearing compression stockings, elevating the legs, losing weight, and engaging in regular activity. In some cases, procedures are done to close off or remove the veins. This information is not intended to replace advice given to you by your health care provider. Make sure you discuss any questions you have with your health care provider. Document Revised: 07/14/2018 Document Reviewed: 06/10/2016 Elsevier Patient Education  2020 [employer]  

## 2020-02-01 ENCOUNTER — Emergency Department: Payer: Self-pay

## 2020-02-01 ENCOUNTER — Other Ambulatory Visit: Payer: Self-pay

## 2020-02-01 ENCOUNTER — Encounter: Payer: Self-pay | Admitting: Emergency Medicine

## 2020-02-01 ENCOUNTER — Emergency Department
Admission: EM | Admit: 2020-02-01 | Discharge: 2020-02-01 | Disposition: A | Discharge status: Home / Self Care | Payer: Self-pay | Attending: Emergency Medicine | Admitting: Emergency Medicine

## 2020-02-01 DIAGNOSIS — Y939 Activity, unspecified: Secondary | ICD-10-CM | POA: Insufficient documentation

## 2020-02-01 DIAGNOSIS — S93402A Sprain of unspecified ligament of left ankle, initial encounter: Secondary | ICD-10-CM | POA: Insufficient documentation

## 2020-02-01 DIAGNOSIS — Y929 Unspecified place or not applicable: Secondary | ICD-10-CM | POA: Insufficient documentation

## 2020-02-01 DIAGNOSIS — X58XXXA Exposure to other specified factors, initial encounter: Secondary | ICD-10-CM | POA: Insufficient documentation

## 2020-02-01 DIAGNOSIS — Y999 Unspecified external cause status: Secondary | ICD-10-CM | POA: Insufficient documentation

## 2020-02-01 MED ORDER — IBUPROFEN 800 MG PO TABS
800.0000 mg | ORAL_TABLET | Freq: Once | ORAL | Status: AC
  Administered 2020-02-01: 800 mg via ORAL
  Filled 2020-02-01: qty 1

## 2020-02-01 MED ORDER — IBUPROFEN 800 MG PO TABS: 800.0000 mg | ORAL_TABLET | Freq: Three times a day (TID) | ORAL | 0 refills | Status: DC | PRN

## 2020-02-01 NOTE — ED Triage Notes (Signed)
Pt arrive via POV following mechanical fall at home. Pt c/o left ankle pain. Pt denies hitting head or LOC.

## 2020-02-01 NOTE — Discharge Instructions (Addendum)
Follow-up with your regular doctor if not improving in 5 to 7 days.  Return emergency department worsening.  Elevate and ice your ankle.  Take ibuprofen as prescribed.

## 2020-02-01 NOTE — ED Notes (Signed)
ED tech Theodoro Grist applied splint to left ankle.

## 2020-02-01 NOTE — ED Provider Notes (Signed)
[facility] Emergency Department Provider Note  ____________________________________________   First MD Initiated Contact with Patient 02/01/20 1605     (approximate)  I have reviewed the triage vital signs and the nursing notes.   HISTORY  Chief Complaint No chief complaint on file.    HPI [name] is a 29 y.o. female presents emergency department complaining of a left ankle injury.  Patient was outside playing with her children and twisted her ankle.  No numbness or tingling.  No other injuries.  States it hurts to bear weight.  Pain scale is 7/10    No past medical history on file.  Patient Active Problem List   Diagnosis Date Noted  . Varicose veins of leg with pain, bilateral 01/26/2020  . Subluxation of left patella 01/03/2019  . Blood type O+ 12/08/2018  . Irregular contractions 04/18/2015  . Postoperative care for cataract 04/18/2015    Past Surgical History:  Procedure Laterality Date  . CESAREAN SECTION    . CESAREAN SECTION N/A   . CESAREAN SECTION N/A 04/18/2015   Procedure: CESAREAN SECTION;  Surgeon: Suzy Bouchard, MD;  Location: [facility] ORS;  Service: Obstetrics;  Laterality: N/A;    Prior to Admission medications   Medication Sig Start Date End Date Taking? Authorizing Provider  docusate sodium (COLACE) 100 MG capsule Take 1 capsule (100 mg total) by mouth 2 (two) times daily as needed. 04/21/15   Christeen Douglas, MD  ibuprofen (ADVIL) 800 MG tablet Take 1 tablet (800 mg total) by mouth every 8 (eight) hours as needed. 02/01/20   Faythe Ghee, PA-C    Allergies Patient has no known allergies.  Family History  Adopted: Yes  Problem Relation Age of Onset  . Alcohol abuse Mother   . Thyroid disease Other     Social History Social History   Tobacco Use  . Smoking status: Never Smoker  . Smokeless tobacco: Never Used  Substance Use Topics  . Alcohol use: No  . Drug use: No    Review of  Systems  Constitutional: No fever/chills Eyes: No visual changes. ENT: No sore throat. Respiratory: Denies cough Genitourinary: Negative for dysuria. Musculoskeletal: Negative for back pain.  Positive for left ankle pain Skin: Negative for rash. Psychiatric: no mood changes,     ____________________________________________   PHYSICAL EXAM:  VITAL SIGNS: ED Triage Vitals  Enc Vitals Group     BP      Pulse      Resp      Temp      Temp src      SpO2      Weight      Height      Head Circumference      Peak Flow      Pain Score      Pain Loc      Pain Edu?      Excl. in GC?     Constitutional: Alert and oriented. Well appearing and in no acute distress. Eyes: Conjunctivae are normal.  Head: Atraumatic. Nose: No congestion/rhinnorhea. Mouth/Throat: Mucous membranes are moist.   Neck:  supple no lymphadenopathy noted Cardiovascular: Normal rate, regular rhythm.  Respiratory: Normal respiratory effort.  No retractions, GU: deferred Musculoskeletal: FROM all extremities, warm and well perfused, left ankle is tender, fifth metatarsal is slightly tender, neurovascular is intact Neurologic:  Normal speech and language.  Skin:  Skin is warm, dry and intact. No rash noted. Psychiatric: Mood and affect are normal.  Speech and behavior are normal.  ____________________________________________   LABS (all labs ordered are listed, but only abnormal results are displayed)  Labs Reviewed - No data to display ____________________________________________   ____________________________________________  RADIOLOGY  X-ray of the left ankle is negative  ____________________________________________   PROCEDURES  Procedure(s) performed: Ace wrap stirrup splint applied by the tech   Procedures    ____________________________________________   INITIAL IMPRESSION / ASSESSMENT AND PLAN / ED COURSE  Pertinent labs & imaging results that were available during my care  of the patient were reviewed by me and considered in my medical decision making (see chart for details).   The patient is a 29 year old female presents emergency department with left ankle injury.  Exam is reassuring.  Anticipate discharge with comfort measures.  X-ray of the left ankle  X-ray of the left ankle is negative.  Explained findings to the patient.  She is to elevate and ice.  Take ibuprofen for pain.  Ace wrap stirrup splint applied by nursing tech.  She is to follow-up with orthopedics if not improving in 1 week.  Is discharged stable condition.    [name] was evaluated in Emergency Department on 02/01/2020 for the symptoms described in the history of present illness. She was evaluated in the context of the global COVID-19 pandemic, which necessitated consideration that the patient might be at risk for infection with the SARS-CoV-2 virus that causes COVID-19. Institutional protocols and algorithms that pertain to the evaluation of patients at risk for COVID-19 are in a state of rapid change based on information released by regulatory bodies including the CDC and federal and state organizations. These policies and algorithms were followed during the patient's care in the ED.    As part of my medical decision making, I reviewed the following data within the electronic MEDICAL RECORD NUMBER Nursing notes reviewed and incorporated, Old chart reviewed, Radiograph reviewed , Notes from prior ED visits and [place] Controlled Substance Database  ____________________________________________   FINAL CLINICAL IMPRESSION(S) / ED DIAGNOSES  Final diagnoses:  Sprain of left ankle, unspecified ligament, initial encounter      NEW MEDICATIONS STARTED DURING THIS VISIT:  New Prescriptions   IBUPROFEN (ADVIL) 800 MG TABLET    Take 1 tablet (800 mg total) by mouth every 8 (eight) hours as needed.     Note:  This document was prepared using Dragon voice recognition software and may include  unintentional dictation errors.    Faythe Ghee, PA-C 02/01/20 1721    Phineas Semen, MD 02/01/20 [phone]

## 2020-02-01 NOTE — ED Notes (Signed)
Pt presents w/ left ankle pain from mechanical fall at home on sidewalk. Pt denies hitting head or LOC. Pt has limited mobility in ankle but has sensation and pulse.

## 2020-04-30 ENCOUNTER — Encounter (INDEPENDENT_AMBULATORY_CARE_PROVIDER_SITE_OTHER): Payer: PRIVATE HEALTH INSURANCE

## 2020-04-30 ENCOUNTER — Ambulatory Visit (INDEPENDENT_AMBULATORY_CARE_PROVIDER_SITE_OTHER): Payer: PRIVATE HEALTH INSURANCE | Admitting: Vascular Surgery

## 2020-06-07 ENCOUNTER — Encounter: Payer: Self-pay | Admitting: Emergency Medicine

## 2020-06-07 ENCOUNTER — Emergency Department
Admission: EM | Admit: 2020-06-07 | Discharge: 2020-06-07 | Disposition: A | Discharge status: Home / Self Care | Payer: Self-pay | Attending: Emergency Medicine | Admitting: Emergency Medicine

## 2020-06-07 ENCOUNTER — Other Ambulatory Visit: Payer: Self-pay

## 2020-06-07 ENCOUNTER — Emergency Department: Payer: Self-pay

## 2020-06-07 DIAGNOSIS — U071 COVID-19: Secondary | ICD-10-CM | POA: Insufficient documentation

## 2020-06-07 MED ORDER — DEXAMETHASONE SODIUM PHOSPHATE 10 MG/ML IJ SOLN
8.0000 mg | Freq: Once | INTRAMUSCULAR | Status: AC
  Administered 2020-06-07: 8 mg via INTRAMUSCULAR
  Filled 2020-06-07: qty 1

## 2020-06-07 NOTE — ED Provider Notes (Signed)
[facility] Emergency Department Provider Note   ____________________________________________    I have reviewed the triage vital signs and the nursing notes.   HISTORY  Chief Complaint Cough     HPI [name] is a 30 y.o. female who reports she has had symptoms for upper respiratory viral symptoms for 2 days, tested positive yesterday at urgent care for COVID-19.  She did receive 2 vaccinations, last vaccination was in October.  Complains primarily of cough, mild shortness of breath.  Body aches, headaches.  History reviewed. No pertinent past medical history.  Patient Active Problem List   Diagnosis Date Noted  . Varicose veins of leg with pain, bilateral 01/26/2020  . Subluxation of left patella 01/03/2019  . Blood type O+ 12/08/2018  . Irregular contractions 04/18/2015  . Postoperative care for cataract 04/18/2015    Past Surgical History:  Procedure Laterality Date  . CESAREAN SECTION    . CESAREAN SECTION N/A   . CESAREAN SECTION N/A 04/18/2015   Procedure: CESAREAN SECTION;  Surgeon: Suzy Bouchard, MD;  Location: [facility] ORS;  Service: Obstetrics;  Laterality: N/A;    Prior to Admission medications   Medication Sig Start Date End Date Taking? Authorizing Provider  docusate sodium (COLACE) 100 MG capsule Take 1 capsule (100 mg total) by mouth 2 (two) times daily as needed. 04/21/15   Christeen Douglas, MD  ibuprofen (ADVIL) 800 MG tablet Take 1 tablet (800 mg total) by mouth every 8 (eight) hours as needed. 02/01/20   Faythe Ghee, PA-C     Allergies Patient has no known allergies.  Family History  Adopted: Yes  Problem Relation Age of Onset  . Alcohol abuse Mother   . Thyroid disease Other     Social History Social History   Tobacco Use  . Smoking status: Never Smoker  . Smokeless tobacco: Never Used  Vaping Use  . Vaping Use: Never used  Substance Use Topics  . Alcohol use: No  . Drug use: No    Review  of Systems  Constitutional: As above  ENT: Congestion, mild sore throat  Respiratory: Positive cough Gastrointestinal: No abdominal pain.  No nausea, no vomiting.    Musculoskeletal: Myalgias Skin: Negative for rash. Neurological: Headaches    ____________________________________________   PHYSICAL EXAM:  VITAL SIGNS: ED Triage Vitals  Enc Vitals Group     BP 06/07/20 0651 110/60     Pulse Rate 06/07/20 0651 81     Resp 06/07/20 0651 (!) 22     Temp 06/07/20 0651 (!) 97.4 F (36.3 C)     Temp Source 06/07/20 0651 Oral     SpO2 06/07/20 0651 99 %     Weight 06/07/20 0652 67.1 kg (148 lb)     Height 06/07/20 0652 1.524 m (5')     Head Circumference --      Peak Flow --      Pain Score 06/07/20 0655 0     Pain Loc --      Pain Edu? --      Excl. in GC? --      Constitutional: Alert and oriented.  Nose: No congestion/rhinnorhea. Mouth/Throat: Mucous membranes are moist.   Cardiovascular: Normal rate, regular rhythm.  Respiratory: Very mild tachypnea.  No retractions.  Musculoskeletal: No lower extremity tenderness nor edema.   Neurologic:  Normal speech and language. No gross focal neurologic deficits are appreciated.   Skin:  Skin is warm, dry and intact. No rash  noted.   ____________________________________________   LABS (all labs ordered are listed, but only abnormal results are displayed)  Labs Reviewed - No data to display ____________________________________________  EKG   ____________________________________________  RADIOLOGY  Chest x-ray viewed by me, no evidence of pneumonia ____________________________________________   PROCEDURES  Procedure(s) performed: No  Procedures   Critical Care performed: No ____________________________________________   INITIAL IMPRESSION / ASSESSMENT AND PLAN / ED COURSE  Pertinent labs & imaging results that were available during my care of the patient were reviewed by me and considered in my  medical decision making (see chart for details).  Patient presents with known COVID-19, symptoms x2 days.  Vital signs are overall reassuring, mild tachypnea but normal oxygenation, no evidence of pneumonia on chest x-ray.  We will give IM Decadron for symptom relief.  No indication for admission or further evaluation at this time.   ____________________________________________   FINAL CLINICAL IMPRESSION(S) / ED DIAGNOSES  Final diagnoses:  COVID-19      NEW MEDICATIONS STARTED DURING THIS VISIT:  New Prescriptions   No medications on file     Note:  This document was prepared using Dragon voice recognition software and may include unintentional dictation errors.   Jene Every, MD 06/07/20 [phone]

## 2020-06-07 NOTE — ED Triage Notes (Signed)
Patient ambulatory to triage with steady gait, without difficulty or distress noted; pt reports yesterday began having sore throat, runny nose and prod cough clear sputum; +COVID test yesterday

## 2020-07-23 ENCOUNTER — Emergency Department
Admission: EM | Admit: 2020-07-23 | Discharge: 2020-07-23 | Disposition: A | Discharge status: Home / Self Care | Payer: Self-pay | Attending: Emergency Medicine | Admitting: Emergency Medicine

## 2020-07-23 ENCOUNTER — Other Ambulatory Visit: Payer: Self-pay

## 2020-07-23 ENCOUNTER — Emergency Department: Payer: Self-pay

## 2020-07-23 DIAGNOSIS — R102 Pelvic and perineal pain: Secondary | ICD-10-CM

## 2020-07-23 DIAGNOSIS — N939 Abnormal uterine and vaginal bleeding, unspecified: Secondary | ICD-10-CM | POA: Insufficient documentation

## 2020-07-23 LAB — CBC WITH DIFFERENTIAL/PLATELET
Abs Immature Granulocytes: 0.04 10*3/uL (ref 0.00–0.07)
Basophils Absolute: 0 10*3/uL (ref 0.0–0.1)
Basophils Relative: 1 %
Eosinophils Absolute: 0.1 10*3/uL (ref 0.0–0.5)
Eosinophils Relative: 2 %
HCT: 36.3 % (ref 36.0–46.0)
Hemoglobin: 12.3 g/dL (ref 12.0–15.0)
Immature Granulocytes: 1 %
Lymphocytes Relative: 30 %
Lymphs Abs: 2.5 10*3/uL (ref 0.7–4.0)
MCH: 31.8 pg (ref 26.0–34.0)
MCHC: 33.9 g/dL (ref 30.0–36.0)
MCV: 93.8 fL (ref 80.0–100.0)
Monocytes Absolute: 0.6 10*3/uL (ref 0.1–1.0)
Monocytes Relative: 7 %
Neutro Abs: 5 10*3/uL (ref 1.7–7.7)
Neutrophils Relative %: 59 %
Platelets: 320 10*3/uL (ref 150–400)
RBC: 3.87 MIL/uL (ref 3.87–5.11)
RDW: 11.5 % (ref 11.5–15.5)
WBC: 8.2 10*3/uL (ref 4.0–10.5)
nRBC: 0 % (ref 0.0–0.2)

## 2020-07-23 LAB — COMPREHENSIVE METABOLIC PANEL
ALT: 36 U/L (ref 0–44)
AST: 21 U/L (ref 15–41)
Albumin: 3.9 g/dL (ref 3.5–5.0)
Alkaline Phosphatase: 67 U/L (ref 38–126)
Anion gap: 7 (ref 5–15)
BUN: 19 mg/dL (ref 6–20)
CO2: 22 mmol/L (ref 22–32)
Calcium: 8.7 mg/dL — ABNORMAL LOW (ref 8.9–10.3)
Chloride: 107 mmol/L (ref 98–111)
Creatinine, Ser: 0.65 mg/dL (ref 0.44–1.00)
GFR, Estimated: >60 mL/min (ref >60)
Glucose, Bld: 100 mg/dL — ABNORMAL HIGH (ref 70–99)
Potassium: 3.8 mmol/L (ref 3.5–5.1)
Sodium: 136 mmol/L (ref 135–145)
Total Bilirubin: 0.7 mg/dL (ref 0.3–1.2)
Total Protein: 6.6 g/dL (ref 6.5–8.1)

## 2020-07-23 LAB — POC URINE PREG, ED: Preg Test, Ur: NEGATIVE

## 2020-07-23 LAB — URINALYSIS, ROUTINE W REFLEX MICROSCOPIC
Bacteria, UA: NONE SEEN
Bilirubin Urine: NEGATIVE
Glucose, UA: NEGATIVE mg/dL
Ketones, ur: NEGATIVE mg/dL
Leukocytes,Ua: NEGATIVE
Nitrite: NEGATIVE
Protein, ur: 100 mg/dL — AB
RBC / HPF: >50 RBC/hpf — ABNORMAL HIGH (ref 0–5)
Specific Gravity, Urine: 1.029 (ref 1.005–1.030)
Squamous Epithelial / HPF: NONE SEEN (ref 0–5)
WBC, UA: NONE SEEN WBC/hpf (ref 0–5)
pH: 5 (ref 5.0–8.0)

## 2020-07-23 NOTE — ED Triage Notes (Signed)
Pt states that she has had her period for x2 weeks now and is passing blood clots, having lower abdominal cramping and pain in her back. Pt states she is nauseous and hasn't had a bowel movement in a few days. Denies v/d.

## 2020-07-23 NOTE — ED Provider Notes (Signed)
[facility]-EMERGENCY DEPARTMENT  ____________________________________________  Time seen: Approximately 9:03 PM  I have reviewed the triage vital signs and the nursing notes.   HISTORY  Chief Complaint Vaginal Bleeding   Historian Patient     HPI [name] is a 30 y.o. female presents to the emergency department with vaginal bleeding that is occurred for the past 2 weeks.  Patient states that she has had bilateral lower pelvic cramping and is passing large clots.  Patient states that her cramping seems to be getting worse and that is why she is coming in tonight.  She denies changes in vaginal discharge.  No dysuria or increased urinary frequency.  Patient denies possibility of pregnancy.  No prior history of hemorrhagic cyst.  Patient states that she has had some nausea but no vomiting.   History reviewed. No pertinent past medical history.   Immunizations up to date:  Yes.     History reviewed. No pertinent past medical history.  Patient Active Problem List   Diagnosis Date Noted  . Varicose veins of leg with pain, bilateral 01/26/2020  . Subluxation of left patella 01/03/2019  . Blood type O+ 12/08/2018  . Irregular contractions 04/18/2015  . Postoperative care for cataract 04/18/2015    Past Surgical History:  Procedure Laterality Date  . CESAREAN SECTION    . CESAREAN SECTION N/A   . CESAREAN SECTION N/A 04/18/2015   Procedure: CESAREAN SECTION;  Surgeon: Suzy Bouchard, MD;  Location: [facility] ORS;  Service: Obstetrics;  Laterality: N/A;    Prior to Admission medications   Medication Sig Start Date End Date Taking? Authorizing Provider  docusate sodium (COLACE) 100 MG capsule Take 1 capsule (100 mg total) by mouth 2 (two) times daily as needed. 04/21/15   Christeen Douglas, MD  ibuprofen (ADVIL) 800 MG tablet Take 1 tablet (800 mg total) by mouth every 8 (eight) hours as needed. 02/01/20   Faythe Ghee, PA-C    Allergies Patient has no known  allergies.  Family History  Adopted: Yes  Problem Relation Age of Onset  . Alcohol abuse Mother   . Thyroid disease Other     Social History Social History   Tobacco Use  . Smoking status: Never Smoker  . Smokeless tobacco: Never Used  Vaping Use  . Vaping Use: Never used  Substance Use Topics  . Alcohol use: No  . Drug use: No     Review of Systems  Constitutional: No fever/chills Eyes:  No discharge ENT: No upper respiratory complaints. Respiratory: no cough. No SOB/ use of accessory muscles to breath Gastrointestinal:   No nausea, no vomiting.  No diarrhea.  No constipation. Genitourinary: Patient has vaginal bleeding.  Musculoskeletal: Negative for musculoskeletal pain. Skin: Negative for rash, abrasions, lacerations, ecchymosis.   ____________________________________________   PHYSICAL EXAM:  VITAL SIGNS: ED Triage Vitals  Enc Vitals Group     BP 07/23/20 2016 115/78     Pulse Rate 07/23/20 2015 85     Resp 07/23/20 2015 18     Temp 07/23/20 2015 98.3 F (36.8 C)     Temp Source 07/23/20 2015 Oral     SpO2 07/23/20 2015 98 %     Weight 07/23/20 2012 144 lb (65.3 kg)     Height 07/23/20 2012 4\' 11"  (1.499 m)     Head Circumference --      Peak Flow --      Pain Score 07/23/20 2012 7     Pain Loc --  Pain Edu? --      Excl. in GC? --      Constitutional: Alert and oriented. Well appearing and in no acute distress. Eyes: Conjunctivae are normal. PERRL. EOMI. Head: Atraumatic. ENT:      Nose: No congestion/rhinnorhea.      Mouth/Throat: Mucous membranes are moist.  Neck: No stridor.  No cervical spine tenderness to palpation. Cardiovascular: Normal rate, regular rhythm. Normal S1 and S2.  Good peripheral circulation. Respiratory: Normal respiratory effort without tachypnea or retractions. Lungs CTAB. Good air entry to the bases with no decreased or absent breath sounds Gastrointestinal: Bowel sounds x 4 quadrants. Soft and nontender to  palpation. No guarding or rigidity. No distention. Musculoskeletal: Full range of motion to all extremities. No obvious deformities noted Neurologic:  Normal for age. No gross focal neurologic deficits are appreciated.  Skin:  Skin is warm, dry and intact. No rash noted. Psychiatric: Mood and affect are normal for age. Speech and behavior are normal.   ____________________________________________   LABS (all labs ordered are listed, but only abnormal results are displayed)  Labs Reviewed  URINALYSIS, ROUTINE W REFLEX MICROSCOPIC - Abnormal; Notable for the following components:      Result Value   Color, Urine AMBER (*)    APPearance CLOUDY (*)    Hgb urine dipstick LARGE (*)    Protein, ur 100 (*)    RBC / HPF >50 (*)    Non Squamous Epithelial PRESENT (*)    All other components within normal limits  COMPREHENSIVE METABOLIC PANEL - Abnormal; Notable for the following components:   Glucose, Bld 100 (*)    Calcium 8.7 (*)    All other components within normal limits  CBC WITH DIFFERENTIAL/PLATELET  POC URINE PREG, ED   ____________________________________________  EKG   ____________________________________________  RADIOLOGY Geraldo Pitter, personally viewed and evaluated these images (plain radiographs) as part of my medical decision making, as well as reviewing the written report by the radiologist.    US PELVIC COMPLETE WITH TRANSVAGINAL  Result Date: 07/23/2020 CLINICAL DATA:  30 year old female with pelvic pain. EXAM: TRANSABDOMINAL AND TRANSVAGINAL ULTRASOUND OF PELVIS TECHNIQUE: Both transabdominal and transvaginal ultrasound examinations of the pelvis were performed. Transabdominal technique was performed for global imaging of the pelvis including uterus, ovaries, adnexal regions, and pelvic cul-de-sac. It was necessary to proceed with endovaginal exam following the transabdominal exam to visualize the endometrium and ovaries. COMPARISON:  None FINDINGS: Uterus  Measurements: 8.1 x 3.4 x 4.8 cm = volume: 70 mL. No fibroids or other mass visualized. Endometrium Thickness: 7 mm.  No focal abnormality visualized. Right ovary Measurements: 3.0 x 1.4 x 1.5 cm = volume: 3.3 mL. Normal appearance/no adnexal mass. Left ovary Measurements: 4.4 x 1.5 x 2.1 cm = volume: 7.0 mL. Normal appearance/no adnexal mass. Other findings No abnormal free fluid. IMPRESSION: Unremarkable pelvic ultrasound. Electronically Signed   By: Elgie Collard M.D.   On: 07/23/2020 23:04    ____________________________________________    PROCEDURES  Procedure(s) performed:     Procedures     Medications - No data to display   ____________________________________________   INITIAL IMPRESSION / ASSESSMENT AND PLAN / ED COURSE  Pertinent labs & imaging results that were available during my care of the patient were reviewed by me and considered in my medical decision making (see chart for details).       Assessment and Plan:  Vaginal bleeding:  30 year old female presents to the emergency department with vaginal bleeding is  occurred for the past 2 weeks which is atypical for her.  Vital signs were reassuring at triage.  On physical exam, patient was alert, active and nontoxic-appearing.  CBC and CMP were reassuring.  Urine pregnancy test was negative.  Urinalysis revealed a large amount of blood consistent with patient's chief complaint but no other acute abnormality.  Pelvic ultrasound revealed no evidence of hemorrhagic ovarian cyst or other acute abnormalities.  Recommended that patient follow-up with [facility].  Return precautions were given to return with new or worsening symptoms.   ____________________________________________  FINAL CLINICAL IMPRESSION(S) / ED DIAGNOSES  Final diagnoses:  Vaginal bleeding      NEW MEDICATIONS STARTED DURING THIS VISIT:  ED Discharge Orders    None          This chart was dictated using voice recognition  software/Dragon. Despite best efforts to proofread, errors can occur which can change the meaning. Any change was purely unintentional.     Orvil Feil, PA-C 07/23/20 2325    Dionne Bucy, MD 07/23/20 [phone]

## 2020-09-12 ENCOUNTER — Ambulatory Visit (HOSPITAL_COMMUNITY): Payer: Self-pay | Admitting: Clinical

## 2020-10-01 ENCOUNTER — Ambulatory Visit (INDEPENDENT_AMBULATORY_CARE_PROVIDER_SITE_OTHER): Payer: PRIVATE HEALTH INSURANCE | Admitting: Clinical

## 2020-10-01 ENCOUNTER — Other Ambulatory Visit: Payer: Self-pay

## 2020-10-01 DIAGNOSIS — Z87828 Personal history of other (healed) physical injury and trauma: Secondary | ICD-10-CM

## 2020-10-01 DIAGNOSIS — F419 Anxiety disorder, unspecified: Secondary | ICD-10-CM | POA: Diagnosis not present

## 2020-10-01 DIAGNOSIS — F32A Depression, unspecified: Secondary | ICD-10-CM | POA: Diagnosis not present
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
## 2020-10-10 ENCOUNTER — Other Ambulatory Visit: Payer: Self-pay

## 2020-10-10 ENCOUNTER — Ambulatory Visit (HOSPITAL_COMMUNITY): Payer: PRIVATE HEALTH INSURANCE | Admitting: Clinical

## 2020-11-07 ENCOUNTER — Other Ambulatory Visit: Payer: Self-pay

## 2020-11-07 ENCOUNTER — Emergency Department: Payer: Managed Care, Other (non HMO)

## 2020-11-07 DIAGNOSIS — Z20822 Contact with and (suspected) exposure to covid-19: Secondary | ICD-10-CM | POA: Insufficient documentation

## 2020-11-07 DIAGNOSIS — M79604 Pain in right leg: Secondary | ICD-10-CM | POA: Insufficient documentation

## 2020-11-07 DIAGNOSIS — R059 Cough, unspecified: Secondary | ICD-10-CM | POA: Insufficient documentation

## 2020-11-07 DIAGNOSIS — R0789 Other chest pain: Secondary | ICD-10-CM | POA: Insufficient documentation

## 2020-11-07 LAB — BASIC METABOLIC PANEL
Anion gap: 8 (ref 5–15)
BUN: 17 mg/dL (ref 6–20)
CO2: 24 mmol/L (ref 22–32)
Calcium: 8.5 mg/dL — ABNORMAL LOW (ref 8.9–10.3)
Chloride: 105 mmol/L (ref 98–111)
Creatinine, Ser: 0.71 mg/dL (ref 0.44–1.00)
GFR, Estimated: >60 mL/min (ref >60)
Glucose, Bld: 123 mg/dL — ABNORMAL HIGH (ref 70–99)
Potassium: 3.4 mmol/L — ABNORMAL LOW (ref 3.5–5.1)
Sodium: 137 mmol/L (ref 135–145)

## 2020-11-07 LAB — CBC
HCT: 39.2 % (ref 36.0–46.0)
Hemoglobin: 13.4 g/dL (ref 12.0–15.0)
MCH: 31.8 pg (ref 26.0–34.0)
MCHC: 34.2 g/dL (ref 30.0–36.0)
MCV: 93.1 fL (ref 80.0–100.0)
Platelets: 278 10*3/uL (ref 150–400)
RBC: 4.21 MIL/uL (ref 3.87–5.11)
RDW: 11.5 % (ref 11.5–15.5)
WBC: 11.2 10*3/uL — ABNORMAL HIGH (ref 4.0–10.5)
nRBC: 0 % (ref 0.0–0.2)

## 2020-11-07 LAB — TROPONIN I (HIGH SENSITIVITY)
Troponin I (High Sensitivity): 4 ng/L (ref <18)
Troponin I (High Sensitivity): 3 ng/L (ref <18)

## 2020-11-07 NOTE — ED Triage Notes (Signed)
Pt with chest pain, persistent cough, and right foot/calf/ ankle swelling. Pt states swelling has gotten better. Pt states CP and cough have been since yesterday. Pt with dry cough, no swelling noted to right leg.

## 2020-11-08 ENCOUNTER — Emergency Department
Admission: EM | Admit: 2020-11-08 | Discharge: 2020-11-08 | Disposition: A | Discharge status: Home / Self Care | Payer: Managed Care, Other (non HMO) | Attending: Emergency Medicine | Admitting: Emergency Medicine

## 2020-11-08 ENCOUNTER — Emergency Department: Payer: Managed Care, Other (non HMO)

## 2020-11-08 DIAGNOSIS — R059 Cough, unspecified: Secondary | ICD-10-CM

## 2020-11-08 DIAGNOSIS — M79604 Pain in right leg: Secondary | ICD-10-CM

## 2020-11-08 DIAGNOSIS — R079 Chest pain, unspecified: Secondary | ICD-10-CM

## 2020-11-08 LAB — HEPATIC FUNCTION PANEL
ALT: 29 U/L (ref 0–44)
AST: 19 U/L (ref 15–41)
Albumin: 3.8 g/dL (ref 3.5–5.0)
Alkaline Phosphatase: 64 U/L (ref 38–126)
Bilirubin, Direct: <0.1 mg/dL (ref 0.0–0.2)
Total Bilirubin: 1 mg/dL (ref 0.3–1.2)
Total Protein: 6.7 g/dL (ref 6.5–8.1)

## 2020-11-08 LAB — RESP PANEL BY RT-PCR (FLU A&B, COVID) ARPGX2
Influenza A by PCR: NEGATIVE
Influenza B by PCR: NEGATIVE
SARS Coronavirus 2 by RT PCR: NEGATIVE

## 2020-11-08 LAB — HCG, QUANTITATIVE, PREGNANCY: hCG, Beta Chain, Quant, S: <1 m[IU]/mL (ref <5)

## 2020-11-08 LAB — LIPASE, BLOOD: Lipase: 34 U/L (ref 11–51)

## 2020-11-08 LAB — D-DIMER, QUANTITATIVE: D-Dimer, Quant: 0.29 ug/mL-FEU (ref 0.00–0.50)

## 2020-11-08 LAB — CK: Total CK: 87 U/L (ref 38–234)

## 2020-11-08 MED ORDER — HYDROCOD POLST-CPM POLST ER 10-8 MG/5ML PO SUER: 5.0000 mL | Freq: Two times a day (BID) | ORAL | 0 refills | Status: DC

## 2020-11-08 MED ORDER — KETOROLAC TROMETHAMINE 30 MG/ML IJ SOLN
15.0000 mg | Freq: Once | INTRAMUSCULAR | Status: AC
  Administered 2020-11-08: 03:00:00 15 mg via INTRAVENOUS
  Filled 2020-11-08: qty 1

## 2020-11-08 MED ORDER — IBUPROFEN 600 MG PO TABS: 600.0000 mg | ORAL_TABLET | Freq: Three times a day (TID) | ORAL | 0 refills | Status: DC | PRN

## 2020-11-08 MED ORDER — FAMOTIDINE IN NACL 20-0.9 MG/50ML-% IV SOLN
20.0000 mg | Freq: Once | INTRAVENOUS | Status: AC
  Administered 2020-11-08: 03:00:00 20 mg via INTRAVENOUS
  Filled 2020-11-08: qty 50

## 2020-11-08 NOTE — Discharge Instructions (Addendum)
1.  You may take Motrin as needed for discomfort. 2.  You may take Tussionex as needed for cough. 3.  Return to the ER for worsening symptoms, persistent vomiting, difficulty breathing or other concerns.

## 2020-11-08 NOTE — ED Provider Notes (Signed)
Emergency Medicine Provider Triage Evaluation Note  Karilyn Cota , a 30 y.o. female  was evaluated in triage.  Pt complains of left lower extremity swelling and pain which she has had for several months.  Also has a mild cough for the last few days.  Today had some soreness in her chest when she is coughing which prompted visit to the ER.  No pleuritic sharp constant chest pain, no personal or family history of PE or DVT, no recent travel immobilization, no hemoptysis.  Patient does have Nexplanon.  Review of Systems  Positive: Cough, chest soreness, right lower extremity pain and swelling Negative: Shortness of breath or fever  Physical Exam  BP 128/69 (BP Location: Left Arm)   Pulse 78   Temp 98.2 F (36.8 C) (Oral)   Resp 19   Ht 5' (1.524 m)   Wt 68 kg   SpO2 100%   BMI 29.29 kg/m  Gen:   Awake, no distress   Resp:  Normal effort, lungs are clear to auscultation MSK:              trace edema bilateral lower extremities slightly worse on the right with some varicose veins on the right leg, strong DP and PT pulses Other:  Heart regular rate and rhythm  Medical Decision Making  Medically screening exam initiated at 12:20 AM.  Appropriate orders placed.  Keeana S Morello was informed that the remainder of the evaluation will be completed by another provider, this initial triage assessment does not replace that evaluation, and the importance of remaining in the ED until their evaluation is complete.  EKG with no ischemic changes.  2 high-sensitivity troponins negative.  Chest x-ray with no acute findings.  Blood work with no acute findings.  Will order ultrasound venous Doppler to rule out a DVT.   Don Perking, [place], MD 11/08/20 Rich Fuchs

## 2020-11-08 NOTE — ED Provider Notes (Signed)
[facility] Emergency Department Provider Note   ____________________________________________   Event Date/Time   First MD Initiated Contact with Patient 11/08/20 [phone]     (approximate)  I have reviewed the triage vital signs and the nursing notes.   HISTORY  Chief Complaint Chest Pain    HPI [name] is a 30 y.o. female who presents to the ED from home with a chief complaint of chest pain, cough and right lower leg pain/swelling.  Patient reports she has had pain/swelling to her right lower extremity intermittently over the past several months.  Presents tonight due to dry cough and central chest discomfort which she describes as sharp, burning and nonradiating.  Pain is not exacerbated by deep breathing or movements.  Patient has an IUD.  Denies fever, shortness of breath, abdominal pain, nausea, vomiting or dizziness.  Denies recent travel or trauma.  Patient is vaccinated but not yet boosted against COVID-19.      Past medical history Varicose veins  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   Blood type O+ 12/08/2018   Irregular contractions 04/18/2015   Postoperative care for cataract 04/18/2015    Past Surgical History:  Procedure Laterality Date   CESAREAN SECTION     CESAREAN SECTION N/A    CESAREAN SECTION N/A 04/18/2015   Procedure: CESAREAN SECTION;  Surgeon: Suzy Bouchard, MD;  Location: [facility] ORS;  Service: Obstetrics;  Laterality: N/A;    Prior to Admission medications   Medication Sig Start Date End Date Taking? Authorizing Provider  chlorpheniramine-HYDROcodone (TUSSIONEX PENNKINETIC ER) 10-8 MG/5ML SUER Take 5 mLs by mouth 2 (two) times daily. 11/08/20  Yes Irean Hong, MD  ibuprofen (ADVIL) 600 MG tablet Take 1 tablet (600 mg total) by mouth every 8 (eight) hours as needed. 11/08/20  Yes Irean Hong, MD   docusate sodium (COLACE) 100 MG capsule Take 1 capsule (100 mg total) by mouth 2 (two) times daily as needed. 04/21/15   Christeen Douglas, MD    Allergies Patient has no known allergies.  Family History  Adopted: Yes  Problem Relation Age of Onset   Alcohol abuse Mother    Thyroid disease Other     Social History Social History   Tobacco Use   Smoking status: Never   Smokeless tobacco: Never  Vaping Use   Vaping Use: Never used  Substance Use Topics   Alcohol use: No   Drug use: No    Review of Systems  Constitutional: No fever/chills Eyes: No visual changes. ENT: No sore throat. Cardiovascular: Positive for chest pain. Respiratory: Positive for cough.  Denies shortness of breath. Gastrointestinal: No abdominal pain.  No nausea, no vomiting.  No diarrhea.  No constipation. Genitourinary: Negative for dysuria. Musculoskeletal: Positive for RLE pain.  Negative for back pain. Skin: Negative for rash. Neurological: Negative for headaches, focal weakness or numbness.   ____________________________________________   PHYSICAL EXAM:  VITAL SIGNS: ED Triage Vitals  Enc Vitals Group     BP 11/07/20 1853 122/70     Pulse Rate 11/07/20 1853 98     Resp 11/07/20 1853 20     Temp 11/07/20 1853 98.8 F (37.1 C)     Temp Source 11/07/20 1853 Oral     SpO2 11/07/20 1853 99 %     Weight 11/07/20 1850 150 lb (68 kg)  Height 11/07/20 1850 5' (1.524 m)     Head Circumference --      Peak Flow --      Pain Score 11/07/20 1849 6     Pain Loc --      Pain Edu? --      Excl. in GC? --     Constitutional: Alert and oriented. Well appearing and in no acute distress. Eyes: Conjunctivae are normal. PERRL. EOMI. Head: Atraumatic. Nose: No congestion/rhinnorhea. Mouth/Throat: Mucous membranes are moist.   Neck: No stridor.   Cardiovascular: Normal rate, regular rhythm. Grossly normal heart sounds.  Good peripheral circulation. Respiratory: Normal respiratory effort.  No  retractions. Lungs CTAB. Gastrointestinal: Soft and nontender to light or deep palpation. No distention. No abdominal bruits. No CVA tenderness. Musculoskeletal: RLE slightly swollen compared to the left.  No tenderness to palpation.  Negative Homans.  2+ distal pulses.  Brisk, less than 5-second capillary refill.  No joint effusions. Neurologic:  Normal speech and language. No gross focal neurologic deficits are appreciated. No gait instability. Skin:  Skin is warm, dry and intact. No rash noted. Psychiatric: Mood and affect are normal. Speech and behavior are normal.  ____________________________________________   LABS (all labs ordered are listed, but only abnormal results are displayed)  Labs Reviewed  BASIC METABOLIC PANEL - Abnormal; Notable for the following components:      Result Value   Potassium 3.4 (*)    Glucose, Bld 123 (*)    Calcium 8.5 (*)    All other components within normal limits  CBC - Abnormal; Notable for the following components:   WBC 11.2 (*)    All other components within normal limits  RESP PANEL BY RT-PCR (FLU A&B, COVID) ARPGX2  HCG, QUANTITATIVE, PREGNANCY  HEPATIC FUNCTION PANEL  LIPASE, BLOOD  D-DIMER, QUANTITATIVE  CK  POC URINE PREG, ED  TROPONIN I (HIGH SENSITIVITY)  TROPONIN I (HIGH SENSITIVITY)   ____________________________________________  EKG  ED ECG REPORT I, Sabiha Sura J, the attending physician, personally viewed and interpreted this ECG.   Date: 11/08/2020  EKG Time: 1848  Rate: 87  Rhythm: normal EKG, normal sinus rhythm  Axis: Normal  Intervals:none  ST&T Change: Nonspecific  ____________________________________________  RADIOLOGY I, Dellar Traber J, personally viewed and evaluated these images (plain radiographs) as part of my medical decision making, as well as reviewing the written report by the radiologist.  ED MD interpretation: No acute cardiopulmonary process; no RLE DVT  Official radiology report(s): DG Chest 2  View  Result Date: 11/07/2020 CLINICAL DATA:  Chest pain EXAM: CHEST - 2 VIEW COMPARISON:  06/07/2020 FINDINGS: The heart size and mediastinal contours are within normal limits. Both lungs are clear. The visualized skeletal structures are unremarkable. IMPRESSION: No active cardiopulmonary disease. Electronically Signed   By: Helyn Numbers MD   On: 11/07/2020 19:33   US Venous Img Lower Unilateral Right  Result Date: 11/08/2020 CLINICAL DATA:  Initial evaluation for right leg pain and swelling for 1 month. EXAM: RIGHT LOWER EXTREMITY VENOUS DOPPLER ULTRASOUND TECHNIQUE: Gray-scale sonography with graded compression, as well as color Doppler and duplex ultrasound were performed to evaluate the lower extremity deep venous systems from the level of the common femoral vein and including the common femoral, femoral, profunda femoral, popliteal and calf veins including the posterior tibial, peroneal and gastrocnemius veins when visible. The superficial great saphenous vein was also interrogated. Spectral Doppler was utilized to evaluate flow at rest and with distal augmentation maneuvers in the common femoral, femoral  and popliteal veins. COMPARISON:  None. FINDINGS: Contralateral Common Femoral Vein: Respiratory phasicity is normal and symmetric with the symptomatic side. No evidence of thrombus. Normal compressibility. Common Femoral Vein: No evidence of thrombus. Normal compressibility, respiratory phasicity and response to augmentation. Saphenofemoral Junction: No evidence of thrombus. Normal compressibility and flow on color Doppler imaging. Profunda Femoral Vein: No evidence of thrombus. Normal compressibility and flow on color Doppler imaging. Femoral Vein: No evidence of thrombus. Normal compressibility, respiratory phasicity and response to augmentation. Popliteal Vein: No evidence of thrombus. Normal compressibility, respiratory phasicity and response to augmentation. Calf Veins: No evidence of thrombus.  Normal compressibility and flow on color Doppler imaging. Superficial Great Saphenous Vein: No evidence of thrombus. Normal compressibility. Venous Reflux:  None. Other Findings:  None. IMPRESSION: No evidence of deep venous thrombosis. Electronically Signed   By: Rise Mu M.D.   On: 11/08/2020 01:59    ____________________________________________   PROCEDURES  Procedure(s) performed (including Critical Care):  .1-3 Lead EKG Interpretation  Date/Time: 11/08/2020 2:27 AM Performed by: Irean Hong, MD Authorized by: Irean Hong, MD     Interpretation: normal     ECG rate:  62   ECG rate assessment: normal     Rhythm: sinus rhythm     Ectopy: none     Conduction: normal   Comments:     Patient placed on cardiac monitor to evaluate for arrhythmias   ____________________________________________   INITIAL IMPRESSION / ASSESSMENT AND PLAN / ED COURSE  As part of my medical decision making, I reviewed the following data within the electronic MEDICAL RECORD NUMBER Nursing notes reviewed and incorporated, Labs reviewed, EKG interpreted, Old chart reviewed, Radiograph reviewed, and Notes from prior ED visits     30 year old female presenting with cough, chest pain and RLE pain/swelling. Differential diagnosis includes, but is not limited to, ACS, aortic dissection, pulmonary embolism, cardiac tamponade, pneumothorax, pneumonia, pericarditis, myocarditis, GI-related causes including esophagitis/gastritis, and musculoskeletal chest wall pain.     2 sets of negative troponins, ultrasound negative for DVT.  We will add LFTs/lipase, CK, D-dimer.  Administer IV Toradol and Pepcid.  Will reassess.  Clinical Course as of 11/08/20 0426  Fri Nov 08, 2020  3267 Patient feeling better.  Updated her of all test results.  Will discharge home with prescription for NSAIDs, analgesia and patient will follow up closely with her PCP.  Strict return precautions given.  Patient verbalizes  understanding agrees with plan of care [JS]    Clinical Course User Index [JS] Irean Hong, MD     ____________________________________________   FINAL CLINICAL IMPRESSION(S) / ED DIAGNOSES  Final diagnoses:  Nonspecific chest pain  Right leg pain  Cough     ED Discharge Orders          Ordered    ibuprofen (ADVIL) 600 MG tablet  Every 8 hours PRN        11/08/20 0326    chlorpheniramine-HYDROcodone (TUSSIONEX PENNKINETIC ER) 10-8 MG/5ML SUER  2 times daily        11/08/20 0326             Note:  This document was prepared using Dragon voice recognition software and may include unintentional dictation errors.    Irean Hong, MD 11/08/20 [phone]

## 2020-11-08 NOTE — ED Notes (Signed)
Pt stated that she has had a cough for a week with no production; pt c/o chest pain started at 10am this morning; stated that she also noticed her right leg was a little swollen in her sleep and her ankle as well was swollen; denies SOB;

## 2020-12-01 ENCOUNTER — Emergency Department (HOSPITAL_COMMUNITY): Payer: Self-pay

## 2020-12-01 ENCOUNTER — Emergency Department (HOSPITAL_COMMUNITY)
Admission: EM | Admit: 2020-12-01 | Discharge: 2020-12-02 | Disposition: A | Discharge status: Home / Self Care | Payer: Self-pay | Attending: Emergency Medicine | Admitting: Emergency Medicine

## 2020-12-01 DIAGNOSIS — R0602 Shortness of breath: Secondary | ICD-10-CM | POA: Insufficient documentation

## 2020-12-01 DIAGNOSIS — E778 Other disorders of glycoprotein metabolism: Secondary | ICD-10-CM | POA: Insufficient documentation

## 2020-12-01 DIAGNOSIS — M7989 Other specified soft tissue disorders: Secondary | ICD-10-CM | POA: Insufficient documentation

## 2020-12-01 LAB — CBC WITH DIFFERENTIAL/PLATELET
Abs Immature Granulocytes: 0.05 10*3/uL (ref 0.00–0.07)
Basophils Absolute: 0 10*3/uL (ref 0.0–0.1)
Basophils Relative: 0 %
Eosinophils Absolute: 0.2 10*3/uL (ref 0.0–0.5)
Eosinophils Relative: 2 %
HCT: 39.4 % (ref 36.0–46.0)
Hemoglobin: 12.9 g/dL (ref 12.0–15.0)
Immature Granulocytes: 1 %
Lymphocytes Relative: 25 %
Lymphs Abs: 2.3 10*3/uL (ref 0.7–4.0)
MCH: 31.8 pg (ref 26.0–34.0)
MCHC: 32.7 g/dL (ref 30.0–36.0)
MCV: 97 fL (ref 80.0–100.0)
Monocytes Absolute: 0.7 10*3/uL (ref 0.1–1.0)
Monocytes Relative: 8 %
Neutro Abs: 5.8 10*3/uL (ref 1.7–7.7)
Neutrophils Relative %: 64 %
Platelets: 268 10*3/uL (ref 150–400)
RBC: 4.06 MIL/uL (ref 3.87–5.11)
RDW: 11.8 % (ref 11.5–15.5)
WBC: 9.2 10*3/uL (ref 4.0–10.5)
nRBC: 0 % (ref 0.0–0.2)

## 2020-12-01 LAB — COMPREHENSIVE METABOLIC PANEL
ALT: 20 U/L (ref 0–44)
AST: 18 U/L (ref 15–41)
Albumin: 3 g/dL — ABNORMAL LOW (ref 3.5–5.0)
Alkaline Phosphatase: 52 U/L (ref 38–126)
Anion gap: 4 — ABNORMAL LOW (ref 5–15)
BUN: 17 mg/dL (ref 6–20)
CO2: 26 mmol/L (ref 22–32)
Calcium: 8.8 mg/dL — ABNORMAL LOW (ref 8.9–10.3)
Chloride: 107 mmol/L (ref 98–111)
Creatinine, Ser: 0.83 mg/dL (ref 0.44–1.00)
GFR, Estimated: >60 mL/min (ref >60)
Glucose, Bld: 100 mg/dL — ABNORMAL HIGH (ref 70–99)
Potassium: 3.7 mmol/L (ref 3.5–5.1)
Sodium: 137 mmol/L (ref 135–145)
Total Bilirubin: 0.7 mg/dL (ref 0.3–1.2)
Total Protein: 5.4 g/dL — ABNORMAL LOW (ref 6.5–8.1)

## 2020-12-01 LAB — I-STAT BETA HCG BLOOD, ED (MC, WL, AP ONLY): I-stat hCG, quantitative: <5 m[IU]/mL (ref <5)

## 2020-12-01 LAB — TROPONIN I (HIGH SENSITIVITY): Troponin I (High Sensitivity): 4 ng/L (ref <18)

## 2020-12-01 NOTE — ED Triage Notes (Signed)
Pt here with c/o chronic swelling to her right leg and ankle , has had several dvt studies which have been negative

## 2020-12-01 NOTE — ED Provider Notes (Signed)
Emergency Medicine Provider Triage Evaluation Note  [name] , a 30 y.o. female  was evaluated in triage.  Pt complains of DOE, shortness of breath, 50 pound weight gain in a few weeks, and worsening right lower extremity edema.  Reports history of fluid retention with bilateral lower extremity edema, however did not go to work today and states that she had her legs elevated much of the day, however her right lower extremity was more edematous than normal for her at the end of today.  Denies any pain, redness, warmth to the touch of her calf.  She is not on any medications every day.  Review of Systems  Positive: Lower extremity edema, abdominal distention, DOE/shortness of breath  Negative: Chest pain, palpitations, abdominal pain, nausea, vomiting, fevers, chills  Physical Exam  BP 132/88 (BP Location: Left Arm)   Pulse 82   Temp 98 F (36.7 C) (Oral)   Resp 16   SpO2 96%  Gen:   Awake, no distress   Resp:  Normal effort  MSK:   Moves extremities without difficulty  Other:  Bilateral lower extremity edema, R>L, 2+ on the right with pitting.  2+ pedal pulses.  RRR ventilator/G.  Lungs CTA B.  Abdomen soft, nondistended, nontender  Medical Decision Making  Medically screening exam initiated at 10:31 PM.  Appropriate orders placed.  Devynne S Meisenheimer was informed that the remainder of the evaluation will be completed by another provider, this initial triage assessment does not replace that evaluation, and the importance of remaining in the ED until their evaluation is complete.  This chart was dictated using voice recognition software, Dragon. Despite the best efforts of this provider to proofread and correct errors, errors may still occur which can change documentation meaning.    Sherrilee Gilles 12/01/20 2233    Koleen Distance, MD 12/02/20 [phone]

## 2020-12-02 ENCOUNTER — Emergency Department (HOSPITAL_COMMUNITY): Payer: Self-pay

## 2020-12-02 LAB — URINALYSIS, ROUTINE W REFLEX MICROSCOPIC
Bilirubin Urine: NEGATIVE
Glucose, UA: NEGATIVE mg/dL
Hgb urine dipstick: NEGATIVE
Ketones, ur: NEGATIVE mg/dL
Leukocytes,Ua: NEGATIVE
Nitrite: NEGATIVE
Protein, ur: NEGATIVE mg/dL
Specific Gravity, Urine: 1.013 (ref 1.005–1.030)
pH: 6 (ref 5.0–8.0)

## 2020-12-02 LAB — BRAIN NATRIURETIC PEPTIDE: B Natriuretic Peptide: 48.8 pg/mL (ref 0.0–100.0)

## 2020-12-02 LAB — TROPONIN I (HIGH SENSITIVITY): Troponin I (High Sensitivity): 4 ng/L (ref <18)

## 2020-12-02 NOTE — Discharge Instructions (Addendum)
You were evaluated in the Emergency Department and after careful evaluation, we did not find any emergent condition requiring admission or further testing in the hospital.  Your exam/testing today is overall reassuring.  Your swelling may be related to low protein levels in your blood.  We recommend follow-up with your primary care doctor to discuss this and continue management or testing.  Please return to the Emergency Department if you experience any worsening of your condition.   Thank you for allowing [place] to be a part of your care.

## 2020-12-02 NOTE — ED Provider Notes (Signed)
[facility]EMER[facility]ENCY DEPT [facility] Emergency Department Provider Note MRN:  [id number]  Arrival date & time: 12/02/20     Chief Complaint   Leg swelling History of Present Illness   [name] is a 30 y.o. year-old female with no pertinent past medical history presenting to the ED with chief complaint of leg swelling.  Swelling to bilateral legs, right greater than left for the past 2 or 3 months.  Has had multiple evaluations with ultrasounds negative for DVT.  Usually the swelling improves during the day but this evening the swelling to the right leg would not go away.  Worse than normal.  Endorses 50 pound weight gain.  Denies fever, no chest pain or shortness of breath, no abdominal pain, no neck or back pain, no night sweats, no other complaints.  Review of Systems  A complete 10 system review of systems was obtained and all systems are negative except as noted in the HPI and PMH.   Patient's Health History   No past medical history on file.  Past Surgical History:  Procedure Laterality Date   CESAREAN SECTION     CESAREAN SECTION N/A    CESAREAN SECTION N/A 04/18/2015   Procedure: CESAREAN SECTION;  Surgeon: Suzy Bouchard, MD;  Location: [facility] ORS;  Service: Obstetrics;  Laterality: N/A;    Family History  Adopted: Yes  Problem Relation Age of Onset   Alcohol abuse Mother    Thyroid disease Other     Social History   Socioeconomic History   Marital status: Single    Spouse name: Not on file   Number of children: Not on file   Years of education: Not on file   Highest education level: Not on file  Occupational History   Not on file  Tobacco Use   Smoking status: Never   Smokeless tobacco: Never  Vaping Use   Vaping Use: Never used  Substance and Sexual Activity   Alcohol use: No   Drug use: No   Sexual activity: Yes  Other Topics Concern   Not on file  Social History Narrative   Not on file   Social Determinants of Health   Financial  Resource Strain: Not on file  Food Insecurity: Not on file  Transportation Needs: Not on file  Physical Activity: Not on file  Stress: Not on file  Social Connections: Not on file  Intimate Partner Violence: Not on file     Physical Exam   Vitals:   12/01/20 2150 12/02/20 0338  BP: 132/88 (!) 145/76  Pulse: 82 (!) 102  Resp: 16 13  Temp: 98 F (36.7 C) 98.9 F (37.2 C)  SpO2: 96%     CONSTITUTIONAL: Well-appearing, NAD NEURO:  Alert and oriented x 3, no focal deficits EYES:  eyes equal and reactive ENT/NECK:  no LAD, no JVD CARDIO: Regular rate, well-perfused, normal S1 and S2 PULM:  CTAB no wheezing or rhonchi GI/GU:  normal bowel sounds, non-distended, non-tender MSK/SPINE:  No gross deformities, no edema SKIN:  no rash, atraumatic PSYCH:  Appropriate speech and behavior  *Additional and/or pertinent findings included in MDM below  Diagnostic and Interventional Summary    EKG Interpretation  Date/Time:  Sunday December 01 2020 22:52:44 EDT Ventricular Rate:  69 PR Interval:  136 QRS Duration: 84 QT Interval:  382 QTC Calculation: 409 R Axis:   28 Text Interpretation: Normal sinus rhythm with sinus arrhythmia Cannot rule out Anterior infarct , age undetermined Abnormal ECG Confirmed by Pilar Plate,  Casimiro Needle [phone]) on 12/02/2020 12:28:22 AM        Labs Reviewed  COMPREHENSIVE METABOLIC PANEL - Abnormal; Notable for the following components:      Result Value   Glucose, Bld 100 (*)    Calcium 8.8 (*)    Total Protein 5.4 (*)    Albumin 3.0 (*)    Anion gap 4 (*)    All other components within normal limits  URINALYSIS, ROUTINE W REFLEX MICROSCOPIC - Abnormal; Notable for the following components:   Color, Urine STRAW (*)    All other components within normal limits  CBC WITH DIFFERENTIAL/PLATELET  BRAIN NATRIURETIC PEPTIDE  I-STAT BETA HCG BLOOD, ED ([facility], [facility], [facility] ONLY)  TROPONIN I (HIGH SENSITIVITY)  TROPONIN I (HIGH SENSITIVITY)    CT ABDOMEN PELVIS WO CONTRAST   Final Result    DG Chest 2 View  Final Result      Medications - No data to display   Procedures  /  Critical Care Procedures  ED Course and Medical Decision Making  I have reviewed the triage vital signs, the nursing notes, and pertinent available records from the EMR.  Listed above are laboratory and imaging tests that I personally ordered, reviewed, and interpreted and then considered in my medical decision making (see below for details).  Considering venous insufficiency however with the asymmetry other etiologies need to be considered.  Overall I have a low concern for DVT given the chronicity of the symptoms and multiple negative ultrasound evaluations.  Her labs are demonstrating hypoproteinemia, which could be contributing.  This leads me to the consideration of nephrotic syndrome, will see if she is wasting protein in the urine with a urinalysis.  This however would not explain the asymmetry.  Also considering a lymphatic obstruction such as a lymphoma in the inguinal or iliac region, will obtain CT abdomen pelvis to evaluate.  This will also aid in the evaluation of the liver, kidneys.     CT is reassuring, no emergent process, appropriate for discharge with PCP follow-up.  Elmer Sow. Pilar Plate, MD [facility] Emergency Medicine [facility] [email or web address]  Final Clinical Impressions(s) / ED Diagnoses     ICD-10-CM   1. Leg swelling  M79.89     2. Hypoproteinemia (HCC)  E77.8       ED Discharge Orders     None        Discharge Instructions Discussed with and Provided to Patient:     Discharge Instructions      You were evaluated in the Emergency Department and after careful evaluation, we did not find any emergent condition requiring admission or further testing in the hospital.  Your exam/testing today is overall reassuring.  Your swelling may be related to low protein levels in your blood.  We recommend follow-up with your primary care  doctor to discuss this and continue management or testing.  Please return to the Emergency Department if you experience any worsening of your condition.   Thank you for allowing [place] to be a part of your care.        Sabas Sous, MD 12/02/20 [phone]

## 2020-12-05 ENCOUNTER — Encounter: Payer: Self-pay | Admitting: Emergency Medicine

## 2020-12-05 ENCOUNTER — Other Ambulatory Visit: Payer: Self-pay

## 2020-12-05 ENCOUNTER — Emergency Department
Admission: EM | Admit: 2020-12-05 | Discharge: 2020-12-05 | Disposition: A | Discharge status: Home / Self Care | Payer: Self-pay | Attending: Emergency Medicine | Admitting: Emergency Medicine

## 2020-12-05 ENCOUNTER — Emergency Department: Payer: Self-pay

## 2020-12-05 DIAGNOSIS — B9689 Other specified bacterial agents as the cause of diseases classified elsewhere: Secondary | ICD-10-CM | POA: Insufficient documentation

## 2020-12-05 DIAGNOSIS — N760 Acute vaginitis: Secondary | ICD-10-CM | POA: Insufficient documentation

## 2020-12-05 DIAGNOSIS — N83201 Unspecified ovarian cyst, right side: Secondary | ICD-10-CM | POA: Insufficient documentation

## 2020-12-05 DIAGNOSIS — R102 Pelvic and perineal pain: Secondary | ICD-10-CM

## 2020-12-05 LAB — CBC
HCT: 39.3 % (ref 36.0–46.0)
Hemoglobin: 13.1 g/dL (ref 12.0–15.0)
MCH: 31.6 pg (ref 26.0–34.0)
MCHC: 33.3 g/dL (ref 30.0–36.0)
MCV: 94.9 fL (ref 80.0–100.0)
Platelets: 259 10*3/uL (ref 150–400)
RBC: 4.14 MIL/uL (ref 3.87–5.11)
RDW: 11.7 % (ref 11.5–15.5)
WBC: 7.5 10*3/uL (ref 4.0–10.5)
nRBC: 0 % (ref 0.0–0.2)

## 2020-12-05 LAB — WET PREP, GENITAL
Sperm: NONE SEEN
Trich, Wet Prep: NONE SEEN
Yeast Wet Prep HPF POC: NONE SEEN

## 2020-12-05 LAB — URINALYSIS, COMPLETE (UACMP) WITH MICROSCOPIC
Bilirubin Urine: NEGATIVE
Glucose, UA: NEGATIVE mg/dL
Ketones, ur: NEGATIVE mg/dL
Leukocytes,Ua: NEGATIVE
Nitrite: NEGATIVE
Protein, ur: NEGATIVE mg/dL
Specific Gravity, Urine: 1.01 (ref 1.005–1.030)
pH: 5 (ref 5.0–8.0)

## 2020-12-05 LAB — COMPREHENSIVE METABOLIC PANEL
ALT: 29 U/L (ref 0–44)
AST: 19 U/L (ref 15–41)
Albumin: 3.7 g/dL (ref 3.5–5.0)
Alkaline Phosphatase: 63 U/L (ref 38–126)
Anion gap: 4 — ABNORMAL LOW (ref 5–15)
BUN: 14 mg/dL (ref 6–20)
CO2: 24 mmol/L (ref 22–32)
Calcium: 8.7 mg/dL — ABNORMAL LOW (ref 8.9–10.3)
Chloride: 109 mmol/L (ref 98–111)
Creatinine, Ser: 0.55 mg/dL (ref 0.44–1.00)
GFR, Estimated: >60 mL/min (ref >60)
Glucose, Bld: 92 mg/dL (ref 70–99)
Potassium: 3.9 mmol/L (ref 3.5–5.1)
Sodium: 137 mmol/L (ref 135–145)
Total Bilirubin: 0.9 mg/dL (ref 0.3–1.2)
Total Protein: 6.5 g/dL (ref 6.5–8.1)

## 2020-12-05 LAB — CHLAMYDIA/NGC RT PCR (ARMC ONLY)
Chlamydia Tr: NOT DETECTED
N gonorrhoeae: NOT DETECTED

## 2020-12-05 LAB — POC URINE PREG, ED: Preg Test, Ur: NEGATIVE

## 2020-12-05 LAB — LIPASE, BLOOD: Lipase: 29 U/L (ref 11–51)

## 2020-12-05 LAB — PREGNANCY, URINE: Preg Test, Ur: NEGATIVE

## 2020-12-05 MED ORDER — KETOROLAC TROMETHAMINE 30 MG/ML IJ SOLN
15.0000 mg | Freq: Once | INTRAMUSCULAR | Status: AC
  Administered 2020-12-05: 15 mg via INTRAVENOUS
  Filled 2020-12-05: qty 1

## 2020-12-05 MED ORDER — LACTATED RINGERS IV BOLUS
1000.0000 mL | Freq: Once | INTRAVENOUS | Status: AC
  Administered 2020-12-05: 1000 mL via INTRAVENOUS

## 2020-12-05 MED ORDER — METRONIDAZOLE 500 MG PO TABS: 500.0000 mg | ORAL_TABLET | Freq: Two times a day (BID) | ORAL | 0 refills | Status: AC

## 2020-12-05 MED ORDER — HYDROMORPHONE HCL 1 MG/ML IJ SOLN
0.5000 mg | Freq: Once | INTRAMUSCULAR | Status: DC
  Filled 2020-12-05: qty 1

## 2020-12-05 MED ORDER — ONDANSETRON HCL 4 MG/2ML IJ SOLN
4.0000 mg | Freq: Once | INTRAMUSCULAR | Status: AC
  Administered 2020-12-05: 4 mg via INTRAVENOUS
  Filled 2020-12-05: qty 2

## 2020-12-05 MED ORDER — ACETAMINOPHEN 500 MG PO TABS
1000.0000 mg | ORAL_TABLET | Freq: Once | ORAL | Status: AC
  Administered 2020-12-05: 1000 mg via ORAL
  Filled 2020-12-05: qty 2

## 2020-12-05 NOTE — ED Triage Notes (Signed)
Pt comes into the ED via POV c/ RLQ abd pain that started today.  Pt states she has had this pain before intermittently, but today it is staying.  Pt has known ovarian cyst, but is unsure if that I what is causing the pain.  PT denies any h/o kidney stones, N/V/D.

## 2020-12-05 NOTE — ED Provider Notes (Signed)
[facility] Emergency Department Provider Note  ____________________________________________   Event Date/Time   First MD Initiated Contact with Patient 12/05/20 1431     (approximate)  I have reviewed the triage vital signs and the nursing notes.   HISTORY  Chief Complaint Abdominal Pain   HPI [name] is a 30 y.o. female with a past medical history of ovarian cysts and prior C-sections who presents for assessment of some right lower quadrant abdominal pain rating to the left side of the abdomen associate with some increased vaginal discharge she states started fairly severe today.  She states this is similar but worse than prior ovarian cyst pain she has had in the past.  States she is a little over midway through her typical menstrual cycle and is not expecting to start bleeding again for several days.  She endorses some nausea but has not had any vomiting, diarrhea, burning with urination, blood in her urine, chest pain, cough or shortness of breath but does state her abdominal pain radiates into her lower back.  No rash or extremity pain recent falls or injuries or other acute complaints.  Denies illicit drug use, tobacco abuse or significant EtOH use.      History reviewed. No pertinent past medical history.  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 Authorizing Provider  metroNIDAZOLE  (FLAGYL) 500 MG tablet Take 1 tablet (500 mg total) by mouth 2 (two) times daily for 7 days. 12/05/20 12/12/20 Yes Gilles ChiquitoSmith, Ardine Iacovelli P, MD  chlorpheniramine-HYDROcodone [facility](TUSSIONEX PENNKINETIC ER) 10-8 MG/5ML SUER Take 5 mLs by mouth 2 (two) times daily. 11/08/20   Irean HongSung, Jade J, MD  docusate sodium (COLACE) 100 MG capsule Take 1 capsule (100 mg total) by mouth 2 (two) times daily as needed. 04/21/15   Christeen DouglasBeasley, Bethany, MD  ibuprofen (ADVIL) 600 MG tablet Take 1 tablet (600 mg total) by mouth every 8 (eight) hours as needed. 11/08/20   Irean HongSung, Jade J, MD    Allergies Patient has no known allergies.  Family History  Adopted: Yes  Problem Relation Age of Onset   Alcohol abuse Mother    Thyroid disease Other     Social History Social History   Tobacco Use   Smoking status: Never   Smokeless tobacco: Never  Vaping Use   Vaping Use: Never used  Substance Use Topics   Alcohol use: No   Drug use: No    Review of Systems  Review of Systems  Constitutional:  Negative for chills and fever.  HENT:  Negative for sore throat.   Eyes:  Negative for pain.  Respiratory:  Negative for cough and stridor.   Cardiovascular:  Negative for chest pain.  Gastrointestinal:  Positive for abdominal pain and nausea. Negative for vomiting.  Genitourinary:  Negative for dysuria.  Musculoskeletal:  Positive for back pain.  Skin:  Negative for rash.  Neurological:  Negative for  seizures, loss of consciousness and headaches.  Psychiatric/Behavioral:  Negative for suicidal ideas.   All other systems reviewed and are negative.    ____________________________________________   PHYSICAL EXAM:  VITAL SIGNS: ED Triage Vitals  Enc Vitals Group     BP 12/05/20 1306 123/74     Pulse Rate 12/05/20 1306 67     Resp 12/05/20 1306 19     Temp 12/05/20 1306 98.3 F (36.8 C)     Temp Source 12/05/20 1306 Oral     SpO2 12/05/20 1306 99 %     Weight 12/05/20 1307 150 lb (68 kg)     Height 12/05/20 1307 5' (1.524 m)      Head Circumference --      Peak Flow --      Pain Score 12/05/20 1306 8     Pain Loc --      Pain Edu? --      Excl. in GC? --    Vitals:   12/05/20 1306  BP: 123/74  Pulse: 67  Resp: 19  Temp: 98.3 F (36.8 C)  SpO2: 99%   Physical Exam Vitals and nursing note reviewed. Exam conducted with a chaperone present.  Constitutional:      General: She is not in acute distress.    Appearance: She is well-developed.  HENT:     Head: Normocephalic and atraumatic.     Right Ear: External ear normal.     Left Ear: External ear normal.     Nose: Nose normal.  Eyes:     Conjunctiva/sclera: Conjunctivae normal.  Cardiovascular:     Rate and Rhythm: Normal rate and regular rhythm.     Heart sounds: No murmur heard. Pulmonary:     Effort: Pulmonary effort is normal. No respiratory distress.     Breath sounds: Normal breath sounds.  Abdominal:     Palpations: Abdomen is soft.     Tenderness: There is no abdominal tenderness.  Genitourinary:    Cervix: Discharge present. No cervical motion tenderness, friability, lesion or cervical bleeding.     Comments: Scant white discharge noted. Musculoskeletal:     Cervical back: Neck supple.  Skin:    General: Skin is warm and dry.     Capillary Refill: Capillary refill takes less than 2 seconds.  Neurological:     Mental Status: She is alert and oriented to person, place, and time.  Psychiatric:        Mood and Affect: Mood normal.    ____________________________________________   LABS (all labs ordered are listed, but only abnormal results are displayed)  Labs Reviewed  WET PREP, GENITAL - Abnormal; Notable for the following components:      Result Value   Clue Cells Wet Prep HPF POC PRESENT (*)    WBC, Wet Prep HPF POC FEW (*)    All other components within normal limits  COMPREHENSIVE METABOLIC PANEL - Abnormal; Notable for the following components:   Calcium 8.7 (*)    Anion gap 4 (*)    All other components within  normal limits  URINALYSIS, COMPLETE (UACMP) WITH MICROSCOPIC - Abnormal; Notable for the following components:   Color, Urine YELLOW (*)    APPearance CLOUDY (*)    Hgb urine dipstick SMALL (*)    Bacteria, UA RARE (*)    All other components within normal limits  CHLAMYDIA/NGC RT PCR ([facility] ONLY)            LIPASE, BLOOD  CBC  PREGNANCY,  URINE  POC URINE PREG, ED   ____________________________________________  EKG  ____________________________________________  RADIOLOGY  ED MD interpretation: Pelvic ultrasound remarkable for complex right ovarian cyst likely hemorrhagic without any evidence of torsion and normal documented ovarian blood flow.  Normal present left ovary.  Official radiology report(s): US PELVIC COMPLETE W TRANSVAGINAL AND TORSION R/O  Result Date: 12/05/2020 CLINICAL DATA:  Pelvic pain for 1 week. EXAM: TRANSABDOMINAL AND TRANSVAGINAL ULTRASOUND OF PELVIS DOPPLER ULTRASOUND OF OVARIES TECHNIQUE: Both transabdominal and transvaginal ultrasound examinations of the pelvis were performed. Transabdominal technique was performed for global imaging of the pelvis including uterus, ovaries, adnexal regions, and pelvic cul-de-sac. It was necessary to proceed with endovaginal exam following the transabdominal exam to visualize the uterus ovaries and adnexa. Color and duplex Doppler ultrasound was utilized to evaluate blood flow to the ovaries. COMPARISON:  pelvic ultrasound 07/23/2020. Abdominopelvic CT 3 days ago 12/02/2020 FINDINGS: Uterus Measurements: 8.9 x 4.2 x 4.9 cm = volume: 95 mL. The uterus is anteverted. No fibroids or other mass visualized. Endometrium Thickness: 4 mm, normal.  No focal abnormality visualized. Right ovary Measurements: 5.9 x 2.7 x 3.6 cm = volume: 30 mL. There is a complex cyst with lacy low level internal echoes measuring 3.6 x 2.2 x 3.4 cm. Blood flow seen to the ovarian parenchyma. Left ovary Measurements: 2.7 x 1.8 x 1.5 cm = volume: 4 mL. Normal  appearance with small follicles. Normal blood flow. No cyst or adnexal mass. Pulsed Doppler evaluation of both ovaries demonstrates normal low-resistance arterial and venous waveforms. Other findings Small volume free fluid in the pelvic cul-de-sac. IMPRESSION: 1. Complex cyst in the right ovary measuring 3.6 cm is likely a hemorrhagic cyst. No ovarian torsion, there is normal ovarian blood flow. 2. Normal sonographic appearance of the uterus and left ovary. Electronically Signed   By: Narda Rutherford M.D.   On: 12/05/2020 17:02    ____________________________________________   PROCEDURES  Procedure(s) performed (including Critical Care):  Procedures   ____________________________________________   INITIAL IMPRESSION / ASSESSMENT AND PLAN / ED COURSE      Patient presents with above to history exam for approximate 1 day of some right lower quadrant abdominal pain this with some nausea and increased vaginal discharge.  On arrival she is afebrile hemodynamically stable.  She does have some right lower quadrant abdominal tenderness and mild bilateral CVA tenderness as well as some white discharge on exam but no cervical motion tenderness or friability erythema or bleeding noted.  Differential includes PID, ovarian cyst, appendicitis, kidney stone, pyelonephritis, cystitis possible appendicitis.  Pelvic ultrasound remarkable for complex right ovarian cyst likely hemorrhagic without any evidence of torsion and normal documented ovarian blood flow.  Normal present left ovary.  I suspect this is likely etiology of patient's pain today.  CMP shows no evidence of hepatitis or cholestasis and there is no right upper quadrant tenderness to suggest cholecystitis.  No other significant electrolyte or metabolic derangements noted.  Lipase not consistent with acute pancreatitis.  No fever or leukocytosis or acute anemia noted.  Low suspicion for appendicitis Given findings of ovarian cyst on ultrasound.   UA is not suggestive of cystitis and overall low suspicion for pyelonephritis at this time.  GU exam is not consistent with PID other wet prep is remarkable for clue cells without evidence of trichomoniasis or yeast.  We will treat with a course of Flagyl.  Pregnancy test is negative.  Patient given analgesia noted below the emergency room.  Advise close outpatient  OB follow-up.  Discharged stable condition.  Strict return precautions advised and discussed.   ____________________________________________   FINAL CLINICAL IMPRESSION(S) / ED DIAGNOSES  Final diagnoses:  Pelvic pain  Bacterial vaginosis  Cyst of right ovary    Medications  HYDROmorphone (DILAUDID) injection 0.5 mg (0.5 mg Intravenous Not Given 12/05/20 1510)  lactated ringers bolus 1,000 mL (0 mLs Intravenous Stopped 12/05/20 1602)  ondansetron (ZOFRAN) injection 4 mg (4 mg Intravenous Given 12/05/20 1505)  acetaminophen (TYLENOL) tablet 1,000 mg (1,000 mg Oral Given 12/05/20 1728)  ketorolac (TORADOL) 30 MG/ML injection 15 mg (15 mg Intravenous Given 12/05/20 1728)     ED Discharge Orders          Ordered    metroNIDAZOLE (FLAGYL) 500 MG tablet  2 times daily        12/05/20 1710             Note:  This document was prepared using Dragon voice recognition software and may include unintentional dictation errors.    Gilles Chiquito, MD 12/05/20 [phone]

## 2020-12-30 ENCOUNTER — Other Ambulatory Visit: Payer: Self-pay

## 2020-12-30 ENCOUNTER — Emergency Department
Admission: EM | Admit: 2020-12-30 | Discharge: 2020-12-30 | Disposition: A | Discharge status: Home / Self Care | Payer: Managed Care, Other (non HMO) | Attending: Emergency Medicine | Admitting: Emergency Medicine

## 2020-12-30 DIAGNOSIS — K120 Recurrent oral aphthae: Secondary | ICD-10-CM | POA: Insufficient documentation

## 2020-12-30 DIAGNOSIS — Z20822 Contact with and (suspected) exposure to covid-19: Secondary | ICD-10-CM | POA: Insufficient documentation

## 2020-12-30 LAB — GROUP A STREP BY PCR: Group A Strep by PCR: NOT DETECTED

## 2020-12-30 LAB — SARS CORONAVIRUS 2 (TAT 6-24 HRS): SARS Coronavirus 2: NEGATIVE

## 2020-12-30 NOTE — ED Triage Notes (Signed)
Pt presents to the ED with c/o sore throat that began 1 week ago.

## 2020-12-30 NOTE — ED Provider Notes (Signed)
[facility] Emergency Department Provider Note  Time seen: 2:40 PM  I have reviewed the triage vital signs and the nursing notes.   HISTORY  Chief Complaint Sore Throat   HPI [name] is a 30 y.o. female with no significant past medical history presents emergency department for mouth pain.  According to the patient there is an area to the back of the left mouth/throat that has been tender over the past almost 1 week.  Patient states possible fever several days ago but none recently.  Denies any cough.  No shortness of breath, largely negative review of systems otherwise.   No past medical history on file.  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   Blood type O+ 12/08/2018   Irregular contractions 04/18/2015   Postoperative care for cataract 04/18/2015    Past Surgical History:  Procedure Laterality Date   CESAREAN SECTION     CESAREAN SECTION N/A    CESAREAN SECTION N/A 04/18/2015   Procedure: CESAREAN SECTION;  Surgeon: Suzy Bouchard, MD;  Location: [facility] ORS;  Service: Obstetrics;  Laterality: N/A;    Prior to Admission medications   Not on File    No Known Allergies  Family History  Adopted: Yes  Problem Relation Age of Onset   Alcohol abuse Mother    Thyroid disease Other     Social History Social History   Tobacco Use   Smoking status: Never   Smokeless tobacco: Never  Vaping Use   Vaping Use: Never used  Substance Use Topics   Alcohol use: No   Drug use: No    Review of Systems Constitutional: Negative for fever. ENT: Sore throat/mouth x1 week Cardiovascular: Negative for chest pain. Respiratory: Negative for shortness of breath.  Negative for cough. Gastrointestinal: Negative for abdominal pain, vomiting  Musculoskeletal: Negative for musculoskeletal complaints Neurological: Negative for  headache All other ROS negative  ____________________________________________   PHYSICAL EXAM:  VITAL SIGNS: ED Triage Vitals  Enc Vitals Group     BP 12/30/20 1356 131/77     Pulse Rate 12/30/20 1356 68     Resp 12/30/20 1356 16     Temp 12/30/20 1356 98 F (36.7 C)     Temp Source 12/30/20 1356 Oral     SpO2 12/30/20 1356 100 %     Weight 12/30/20 1355 159 lb (72.1 kg)     Height 12/30/20 1355 5' (1.524 m)     Head Circumference --      Peak Flow --      Pain Score 12/30/20 1355 6     Pain Loc --      Pain Edu? --      Excl. in GC? --    Constitutional: Alert and oriented. Well appearing and in no distress. Eyes: Normal exam ENT      Head: Normocephalic and atraumatic.      Mouth/Throat: Mucous membranes are moist.  Patient has nearly just behind her left molars consistent with an aphthous ulcer.  No pharyngeal erythema or exudate. Cardiovascular: Normal rate, regular rhythm. Respiratory: Normal respiratory effort without tachypnea nor retractions. Breath sounds are clear  Gastrointestinal: Soft and nontender. No distention.   Musculoskeletal: Nontender with normal range of motion in all extremities.  Neurologic:  Normal speech and language. No gross focal neurologic deficits  Skin:  Skin is warm, dry and intact.  Psychiatric: Mood and affect are normal.   ____________________________________________    INITIAL IMPRESSION / ASSESSMENT AND PLAN / ED COURSE  Pertinent labs & imaging results that were available during my care of the patient were reviewed by me and considered in my medical decision making (see chart for details).   Patient presents to the emergency department for a sore in her mouth consistent with an aphthous ulcer on examination.  Differential would include coxsackievirus, other viral illness, denies any new sexual partners or concern for STDs, no history of herpes simplex.  Strep and COVID swab were sent from triage.  As long as strep test is negative  we will plan to discharge home with supportive care including Tylenol or ibuprofen if needed, over-the-counter Chloraseptic sprays for discomfort.  Patient agreeable to plan of care.  Suspect likely self-limiting viral illness.  [name] was evaluated in Emergency Department on 12/30/2020 for the symptoms described in the history of present illness. She was evaluated in the context of the global COVID-19 pandemic, which necessitated consideration that the patient might be at risk for infection with the SARS-CoV-2 virus that causes COVID-19. Institutional protocols and algorithms that pertain to the evaluation of patients at risk for COVID-19 are in a state of rapid change based on information released by regulatory bodies including the CDC and federal and state organizations. These policies and algorithms were followed during the patient's care in the ED.  ____________________________________________   FINAL CLINICAL IMPRESSION(S) / ED DIAGNOSES  Aphthous ulcer   Minna Antis, MD 12/30/20 1442

## 2021-03-31 ENCOUNTER — Other Ambulatory Visit: Payer: Self-pay

## 2021-03-31 ENCOUNTER — Encounter (HOSPITAL_COMMUNITY): Payer: Self-pay

## 2021-03-31 ENCOUNTER — Emergency Department (HOSPITAL_COMMUNITY)
Admission: EM | Admit: 2021-03-31 | Discharge: 2021-04-01 | Disposition: A | Discharge status: Home / Self Care | Payer: Self-pay | Attending: Emergency Medicine | Admitting: Emergency Medicine

## 2021-03-31 DIAGNOSIS — Z5321 Procedure and treatment not carried out due to patient leaving prior to being seen by health care provider: Secondary | ICD-10-CM | POA: Insufficient documentation

## 2021-03-31 DIAGNOSIS — R2232 Localized swelling, mass and lump, left upper limb: Secondary | ICD-10-CM | POA: Insufficient documentation

## 2021-03-31 NOTE — ED Triage Notes (Signed)
Pt reports hard painful lump to her left axilla that she noticed last night. No visible abscess or wound seen but states it is under her skin. Denies any other symptoms.

## 2021-04-01 NOTE — ED Notes (Signed)
Patient states she is leaving now

## 2021-04-04 ENCOUNTER — Encounter (HOSPITAL_COMMUNITY): Payer: Self-pay | Admitting: Student

## 2021-04-04 ENCOUNTER — Emergency Department (HOSPITAL_COMMUNITY)
Admission: EM | Admit: 2021-04-04 | Discharge: 2021-04-05 | Disposition: A | Discharge status: Home / Self Care | Payer: Self-pay | Attending: Emergency Medicine | Admitting: Emergency Medicine

## 2021-04-04 ENCOUNTER — Other Ambulatory Visit: Payer: Self-pay

## 2021-04-04 DIAGNOSIS — J111 Influenza due to unidentified influenza virus with other respiratory manifestations: Secondary | ICD-10-CM

## 2021-04-04 DIAGNOSIS — Z20822 Contact with and (suspected) exposure to covid-19: Secondary | ICD-10-CM | POA: Insufficient documentation

## 2021-04-04 DIAGNOSIS — N9489 Other specified conditions associated with female genital organs and menstrual cycle: Secondary | ICD-10-CM | POA: Insufficient documentation

## 2021-04-04 DIAGNOSIS — J101 Influenza due to other identified influenza virus with other respiratory manifestations: Secondary | ICD-10-CM | POA: Insufficient documentation

## 2021-04-04 DIAGNOSIS — N3000 Acute cystitis without hematuria: Secondary | ICD-10-CM | POA: Insufficient documentation

## 2021-04-04 LAB — CBC WITH DIFFERENTIAL/PLATELET
Abs Immature Granulocytes: 0.03 10*3/uL (ref 0.00–0.07)
Basophils Absolute: 0 10*3/uL (ref 0.0–0.1)
Basophils Relative: 1 %
Eosinophils Absolute: 0.1 10*3/uL (ref 0.0–0.5)
Eosinophils Relative: 1 %
HCT: 39.6 % (ref 36.0–46.0)
Hemoglobin: 13.1 g/dL (ref 12.0–15.0)
Immature Granulocytes: 1 %
Lymphocytes Relative: 8 %
Lymphs Abs: 0.5 10*3/uL — ABNORMAL LOW (ref 0.7–4.0)
MCH: 31 pg (ref 26.0–34.0)
MCHC: 33.1 g/dL (ref 30.0–36.0)
MCV: 93.8 fL (ref 80.0–100.0)
Monocytes Absolute: 0.5 10*3/uL (ref 0.1–1.0)
Monocytes Relative: 8 %
Neutro Abs: 5.2 10*3/uL (ref 1.7–7.7)
Neutrophils Relative %: 81 %
Platelets: 282 10*3/uL (ref 150–400)
RBC: 4.22 MIL/uL (ref 3.87–5.11)
RDW: 11.3 % — ABNORMAL LOW (ref 11.5–15.5)
WBC: 6.3 10*3/uL (ref 4.0–10.5)
nRBC: 0 % (ref 0.0–0.2)

## 2021-04-04 LAB — RESP PANEL BY RT-PCR (FLU A&B, COVID) ARPGX2
Influenza A by PCR: POSITIVE — AB
Influenza B by PCR: NEGATIVE
SARS Coronavirus 2 by RT PCR: NEGATIVE

## 2021-04-04 MED ORDER — ACETAMINOPHEN 325 MG PO TABS
650.0000 mg | ORAL_TABLET | Freq: Once | ORAL | Status: AC
  Administered 2021-04-04: 650 mg via ORAL
  Filled 2021-04-04: qty 2

## 2021-04-04 NOTE — ED Provider Notes (Signed)
Emergency Medicine Provider Triage Evaluation Note  [name] , a 30 y.o. female  was evaluated in triage.  Pt complains of dysuria today. Patient reports dysuria, suprapubic pain, and left flank pain, tried OTC urinary relief medicine without improvement. Also has had some congestion. Kids tested positive for the flu at home.   Review of Systems  Positive: Fever, chills, congestion, dysuria, abdominal pain, flank pain Negative: Chest pain, syncope, vomiting.   Physical Exam  BP (!) 141/90   Pulse (!) 116   Temp (!) 101.4 F (38.6 C)   Resp 16   LMP 03/31/2021 (Exact Date)   SpO2 100%  Gen:   Awake, no distress   Resp:  Normal effort  MSK:   Moves extremities without difficulty  Other:  Tachycardic. L CVA TTP. Suprapubic tenderness to palpation. No peritoneal signs.   Medical Decision Making  Medically screening exam initiated at 11:00 PM.  Appropriate orders placed.  [name] was informed that the remainder of the evaluation will be completed by another provider, this initial triage assessment does not replace that evaluation, and the importance of remaining in the ED until their evaluation is complete.  Covid/flu testing ordered.  Febrile & tachycardic with urinary sxs & flank pain- labs ordered for further assessment.    Cherly Anderson, PA-C 04/04/21 2310    Sloan Leiter, DO 04/04/21 2311

## 2021-04-04 NOTE — ED Triage Notes (Signed)
Pt reports that daughter tested + for flu and she now has flu symptoms, pt also having urinary symptoms or dysuria and dark urine.

## 2021-04-05 LAB — URINALYSIS, ROUTINE W REFLEX MICROSCOPIC
Bacteria, UA: NONE SEEN
Bilirubin Urine: NEGATIVE
Glucose, UA: NEGATIVE mg/dL
Ketones, ur: NEGATIVE mg/dL
Nitrite: POSITIVE — AB
Protein, ur: NEGATIVE mg/dL
Specific Gravity, Urine: 1.008 (ref 1.005–1.030)
pH: 7 (ref 5.0–8.0)

## 2021-04-05 LAB — COMPREHENSIVE METABOLIC PANEL
ALT: 15 U/L (ref 0–44)
AST: 16 U/L (ref 15–41)
Albumin: 3.9 g/dL (ref 3.5–5.0)
Alkaline Phosphatase: 77 U/L (ref 38–126)
Anion gap: 8 (ref 5–15)
BUN: 9 mg/dL (ref 6–20)
CO2: 22 mmol/L (ref 22–32)
Calcium: 8.9 mg/dL (ref 8.9–10.3)
Chloride: 105 mmol/L (ref 98–111)
Creatinine, Ser: 0.78 mg/dL (ref 0.44–1.00)
GFR, Estimated: >60 mL/min (ref >60)
Glucose, Bld: 117 mg/dL — ABNORMAL HIGH (ref 70–99)
Potassium: 3.5 mmol/L (ref 3.5–5.1)
Sodium: 135 mmol/L (ref 135–145)
Total Bilirubin: 1.1 mg/dL (ref 0.3–1.2)
Total Protein: 7.1 g/dL (ref 6.5–8.1)

## 2021-04-05 LAB — PROTIME-INR
INR: 1.1 (ref 0.8–1.2)
Prothrombin Time: 13.7 seconds (ref 11.4–15.2)

## 2021-04-05 LAB — APTT: aPTT: 27 seconds (ref 24–36)

## 2021-04-05 LAB — LACTIC ACID, PLASMA: Lactic Acid, Venous: 0.7 mmol/L (ref 0.5–1.9)

## 2021-04-05 LAB — I-STAT BETA HCG BLOOD, ED (MC, WL, AP ONLY): I-stat hCG, quantitative: <5 m[IU]/mL (ref <5)

## 2021-04-05 MED ORDER — BENZONATATE 100 MG PO CAPS: 100.0000 mg | ORAL_CAPSULE | Freq: Three times a day (TID) | ORAL | 0 refills | Status: DC

## 2021-04-05 MED ORDER — NAPROXEN 500 MG PO TABS: 500.0000 mg | ORAL_TABLET | Freq: Two times a day (BID) | ORAL | 0 refills | Status: DC

## 2021-04-05 MED ORDER — ONDANSETRON 4 MG PO TBDP: 4.0000 mg | ORAL_TABLET | Freq: Three times a day (TID) | ORAL | 0 refills | Status: DC | PRN

## 2021-04-05 MED ORDER — PHENAZOPYRIDINE HCL 95 MG PO TABS: 95.0000 mg | ORAL_TABLET | Freq: Three times a day (TID) | ORAL | 0 refills | Status: DC | PRN

## 2021-04-05 MED ORDER — FLUCONAZOLE 150 MG PO TABS: 150.0000 mg | ORAL_TABLET | Freq: Every day | ORAL | 0 refills | Status: DC

## 2021-04-05 MED ORDER — CEPHALEXIN 500 MG PO CAPS: 500.0000 mg | ORAL_CAPSULE | Freq: Three times a day (TID) | ORAL | 0 refills | Status: AC

## 2021-04-05 NOTE — ED Provider Notes (Signed)
[facility] EMERGENCY DEPARTMENT Provider Note   CSN: [id number] Arrival date & time: 04/04/21  2241     History Chief Complaint  Patient presents with   Influenza    [name] is a 30 y.o. female with patient past medical history who presents for evaluation of UTI symptoms and URI symptoms.  UTI symptoms began 1 week ago.  Has had some dysuria, frequency.  States her urine is darker than normal.  Took OTC meds at home without relief.  No pelvic pain, flank pain.  No concerns for any STDs.  Also with cough, congestion.  Her children tested positive for influenza.  Subjective fever.  No headache, chest pain, shortness of breath, lower extremity edema, nausea, vomiting, diarrhea.  Denies additional aggravating or alleviating factors.  History obtained from patient and past medical records.  No interpreter used.  HPI     History reviewed. No pertinent past medical history.  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   Blood type O+ 12/08/2018   Irregular contractions 04/18/2015   Postoperative care for cataract 04/18/2015    Past Surgical History:  Procedure Laterality Date   CESAREAN SECTION     CESAREAN SECTION N/A    CESAREAN SECTION N/A 04/18/2015   Procedure: CESAREAN SECTION;  Surgeon: Suzy Bouchard, MD;  Location: [facility] ORS;  Service: Obstetrics;  Laterality: N/A;     OB History     Gravida  4   Para  3   Term  1   Preterm  1   AB  1   Living  3      SAB  1   IAB  0   Ectopic  0   Multiple  0   Live Births  3           Family History  Adopted: Yes  Problem Relation Age of Onset   Alcohol abuse Mother    Thyroid disease Other     Social History   Tobacco Use   Smoking status: Never   Smokeless tobacco: Never  Vaping Use   Vaping Use: Never used  Substance Use Topics   Alcohol use: No   Drug  use: No    Home Medications Prior to Admission medications   Medication Sig Start Date End Date Taking? Authorizing Provider  benzonatate (TESSALON) 100 MG capsule Take 1 capsule (100 mg total) by mouth every 8 (eight) hours. 04/05/21  Yes Ronniesha Seibold A, PA-C  cephALEXin (KEFLEX) 500 MG capsule Take 1 capsule (500 mg total) by mouth 3 (three) times daily for 7 days. 04/05/21 04/12/21 Yes Giulianna Rocha A, PA-C  fluconazole (DIFLUCAN) 150 MG tablet Take 1 tablet (150 mg total) by mouth daily. 04/05/21  Yes Joab Carden A, PA-C  naproxen (NAPROSYN) 500 MG tablet Take 1 tablet (500 mg total) by mouth 2 (two) times daily. 04/05/21  Yes Guiliana Shor A, PA-C  ondansetron (ZOFRAN ODT) 4 MG disintegrating tablet Take 1 tablet (4 mg total) by mouth every 8 (eight) hours as needed for nausea or vomiting. 04/05/21  Yes Mae Cianci A, PA-C  phenazopyridine (PYRIDIUM) 95 MG tablet Take 1 tablet (95 mg total) by mouth 3 (three) times daily as needed for pain. 04/05/21  Yes Serenitie Vinton A, PA-C    Allergies    Patient has no known allergies.  Review of Systems   Review  of Systems  Constitutional:  Positive for chills and fever.  HENT:  Positive for congestion and rhinorrhea. Negative for sore throat, trouble swallowing and voice change.   Eyes: Negative.   Respiratory:  Positive for cough. Negative for choking, shortness of breath, wheezing and stridor.   Cardiovascular: Negative.   Gastrointestinal:  Positive for abdominal pain. Negative for constipation, diarrhea, nausea and vomiting.  Genitourinary:  Positive for dysuria, frequency and urgency. Negative for decreased urine volume, flank pain, menstrual problem, pelvic pain, vaginal bleeding, vaginal discharge and vaginal pain.  Musculoskeletal: Negative.   Skin: Negative.   Neurological: Negative.   All other systems reviewed and are negative.  Physical Exam Updated Vital Signs BP 117/77   Pulse 91   Temp 98.3 F (36.8 C)  (Oral)   Resp 16   LMP 03/31/2021 (Exact Date)   SpO2 97%   Physical Exam Vitals and nursing note reviewed.  Constitutional:      General: She is not in acute distress.    Appearance: She is well-developed. She is not ill-appearing, toxic-appearing or diaphoretic.  HENT:     Head: Normocephalic and atraumatic.     Nose: Congestion and rhinorrhea present.     Mouth/Throat:     Mouth: Mucous membranes are moist.  Eyes:     Pupils: Pupils are equal, round, and reactive to light.  Cardiovascular:     Rate and Rhythm: Normal rate.     Pulses: Normal pulses.     Heart sounds: Normal heart sounds.  Pulmonary:     Effort: Pulmonary effort is normal. No respiratory distress.     Breath sounds: Normal breath sounds.     Comments: Clear bil, speaks in full sentences without difficulty Abdominal:     General: Bowel sounds are normal. There is no distension.     Palpations: Abdomen is soft.     Tenderness: There is abdominal tenderness. There is no right CVA tenderness, left CVA tenderness, guarding or rebound.     Comments: Mild suprapubic tenderness, neg CVA tap bl  Musculoskeletal:        General: No swelling or tenderness. Normal range of motion.     Cervical back: Normal range of motion.  Skin:    General: Skin is warm and dry.     Capillary Refill: Capillary refill takes less than 2 seconds.  Neurological:     General: No focal deficit present.     Mental Status: She is alert and oriented to person, place, and time.  Psychiatric:        Mood and Affect: Mood normal.    ED Results / Procedures / Treatments   Labs (all labs ordered are listed, but only abnormal results are displayed) Labs Reviewed  RESP PANEL BY RT-PCR (FLU A&B, COVID) ARPGX2 - Abnormal; Notable for the following components:      Result Value   Influenza A by PCR POSITIVE (*)    All other components within normal limits  COMPREHENSIVE METABOLIC PANEL - Abnormal; Notable for the following components:    Glucose, Bld 117 (*)    All other components within normal limits  CBC WITH DIFFERENTIAL/PLATELET - Abnormal; Notable for the following components:   RDW 11.3 (*)    Lymphs Abs 0.5 (*)    All other components within normal limits  URINALYSIS, ROUTINE W REFLEX MICROSCOPIC - Abnormal; Notable for the following components:   Color, Urine AMBER (*)    Hgb urine dipstick SMALL (*)    Nitrite POSITIVE (*)  Leukocytes,Ua LARGE (*)    All other components within normal limits  URINE CULTURE  CULTURE, BLOOD (ROUTINE X 2)  CULTURE, BLOOD (ROUTINE X 2)  PROTIME-INR  APTT  LACTIC ACID, PLASMA  LACTIC ACID, PLASMA  I-STAT BETA HCG BLOOD, ED ([facility], [facility], [facility] ONLY)   EKG None  Radiology No results found.  Procedures Procedures   Medications Ordered in ED Medications  acetaminophen (TYLENOL) tablet 650 mg (650 mg Oral Given 04/04/21 2313)   ED Course  I have reviewed the triage vital signs and the nursing notes.  Pertinent labs & imaging results that were available during my care of the patient were reviewed by me and considered in my medical decision making (see chart for details).  Here for UR complaints and UT symptoms. Febrile and tachycardic on triage exam, defervesce with antipyretic. Does not appear septic or ill. Heart and lungs clear, Abd soft, no focal pain, neg CVA tap. No clinical evidence of VTE on exam. Tolerating PO intake. Children with influenza  Work up started from triage today personally reviewed and interpreted:  CBC without leukocytosis CMP glucose 117 no additional abnormality Lactic 0.7 Preg neg Flu A positive UA positive for infection, patient concerned for possible yeast infection, will start on diflucan  The patient has been appropriately medically screened and/or stabilized in the ED. I have low suspicion for any other emergent medical condition which would require further screening, evaluation or treatment in the ED or require inpatient  management.  Patient is hemodynamically stable and in no acute distress.  Patient able to ambulate in department prior to ED.  Evaluation does not show acute pathology that would require ongoing or additional emergent interventions while in the emergency department or further inpatient treatment.  I have discussed the diagnosis with the patient and answered all questions.  Pain is been managed while in the emergency department and patient has no further complaints prior to discharge.  Patient is comfortable with plan discussed in room and is stable for discharge at this time.  I have discussed strict return precautions for returning to the emergency department.  Patient was encouraged to follow-up with PCP/specialist refer to at discharge.     MDM Rules/Calculators/A&P                           Final Clinical Impression(s) / ED Diagnoses Final diagnoses:  Influenza  Acute cystitis without hematuria    Rx / DC Orders ED Discharge Orders          Ordered    cephALEXin (KEFLEX) 500 MG capsule  3 times daily        04/05/21 0726    ondansetron (ZOFRAN ODT) 4 MG disintegrating tablet  Every 8 hours PRN        04/05/21 0726    naproxen (NAPROSYN) 500 MG tablet  2 times daily        04/05/21 0726    benzonatate (TESSALON) 100 MG capsule  Every 8 hours        04/05/21 0726    phenazopyridine (PYRIDIUM) 95 MG tablet  3 times daily PRN        04/05/21 0726    fluconazole (DIFLUCAN) 150 MG tablet  Daily        04/05/21 0729             Celestine Bougie A, PA-C 04/05/21 0730    Davonna Belling, MD 04/05/21 1554

## 2021-04-05 NOTE — Discharge Instructions (Addendum)
Flu test positive> Tessalon pearls for cough, naproxen for body aches, does continue Ibuprofen do not take any additional Ibuprofen while taking his medication.  UTI> Kelfex is the antibiotic take until completed. Pyridium is the UTI pain medication, take as needed. Zofran is for any nausea.  Possible yeast infection> Take the Diflucan dose with first dose of antibiotics. If still having symptoms, take additional dose at 48 hours.  Follow up with Primary care provider early next week or return for new or worsening symptoms

## 2021-04-06 LAB — URINE CULTURE

## 2021-04-07 ENCOUNTER — Ambulatory Visit: Payer: Self-pay

## 2021-04-07 ENCOUNTER — Telehealth (HOSPITAL_BASED_OUTPATIENT_CLINIC_OR_DEPARTMENT_OTHER): Payer: Self-pay | Admitting: Emergency Medicine

## 2021-04-07 LAB — BLOOD CULTURE ID PANEL (REFLEXED) - BCID2

## 2021-04-09 LAB — CULTURE, BLOOD (ROUTINE X 2)
Culture: NO GROWTH
Special Requests: ADEQUATE
Special Requests: ADEQUATE

## 2021-04-16 ENCOUNTER — Encounter: Payer: Self-pay | Admitting: Emergency Medicine

## 2021-04-16 ENCOUNTER — Emergency Department
Admission: EM | Admit: 2021-04-16 | Discharge: 2021-04-16 | Disposition: A | Discharge status: Home / Self Care | Payer: Self-pay | Attending: Emergency Medicine | Admitting: Emergency Medicine

## 2021-04-16 ENCOUNTER — Other Ambulatory Visit: Payer: Self-pay

## 2021-04-16 DIAGNOSIS — L03115 Cellulitis of right lower limb: Secondary | ICD-10-CM | POA: Insufficient documentation

## 2021-04-16 MED ORDER — SULFAMETHOXAZOLE-TRIMETHOPRIM 800-160 MG PO TABS: 1.0000 | ORAL_TABLET | Freq: Two times a day (BID) | ORAL | 0 refills | Status: DC

## 2021-04-16 MED ORDER — MUPIROCIN CALCIUM 2 % EX CREA: 1.0000 "application " | TOPICAL_CREAM | Freq: Two times a day (BID) | CUTANEOUS | 0 refills | Status: AC

## 2021-04-16 MED ORDER — CEPHALEXIN 500 MG PO CAPS: 500.0000 mg | ORAL_CAPSULE | Freq: Three times a day (TID) | ORAL | 0 refills | Status: AC

## 2021-04-16 NOTE — Discharge Instructions (Signed)
Take Bactrim twice daily for the next days. Take Keflex three times daily for the next 10 days.  Take Mupirocin twice daily for seven days.

## 2021-04-16 NOTE — ED Provider Notes (Signed)
[facility]-EMERGENCY DEPARTMENT  ____________________________________________  Time seen: Approximately 5:46 PM  I have reviewed the triage vital signs and the nursing notes.   HISTORY  Chief Complaint Insect Bite   Historian Patient     HPI [name] is a 30 y.o. female presents to the emergency department with a 5 cm region of circumferential cellulitis along the right lower extremity.  Patient denies fever and chills.  She states that affected area originally started as a small region of circumferential cellulitis with expression of some provements that has been spontaneously draining.  No prior history of MRSA or skin infections in the past.   History reviewed. No pertinent past medical history.   Immunizations up to date:  Yes.     History reviewed. No pertinent past medical history.  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   Blood type O+ 12/08/2018   Irregular contractions 04/18/2015   Postoperative care for cataract 04/18/2015    Past Surgical History:  Procedure Laterality Date   CESAREAN SECTION     CESAREAN SECTION N/A    CESAREAN SECTION N/A 04/18/2015   Procedure: CESAREAN SECTION;  Surgeon: Suzy Bouchard, MD;  Location: [facility] ORS;  Service: Obstetrics;  Laterality: N/A;    Prior to Admission medications   Medication Sig Start Date End Date Taking? Authorizing Provider  cephALEXin (KEFLEX) 500 MG capsule Take 1 capsule (500 mg total) by mouth 3 (three) times daily for 10 days. 04/16/21 04/26/21 Yes Pia Mau M, PA-C  mupirocin cream (BACTROBAN) 2 % Apply 1 application topically 2 (two) times daily for 7 days. Apply twice daily for the next seven days. 04/16/21 04/23/21 Yes Pia Mau M, PA-C  sulfamethoxazole-trimethoprim (BACTRIM DS) 800-160 MG tablet Take 1 tablet by mouth 2 (two) times daily for 7 days. 04/16/21 04/23/21 Yes  Pia Mau M, PA-C  benzonatate (TESSALON) 100 MG capsule Take 1 capsule (100 mg total) by mouth every 8 (eight) hours. 04/05/21   Henderly, Britni A, PA-C  fluconazole (DIFLUCAN) 150 MG tablet Take 1 tablet (150 mg total) by mouth daily. 04/05/21   Henderly, Britni A, PA-C  naproxen (NAPROSYN) 500 MG tablet Take 1 tablet (500 mg total) by mouth 2 (two) times daily. 04/05/21   Henderly, Britni A, PA-C  ondansetron (ZOFRAN ODT) 4 MG disintegrating tablet Take 1 tablet (4 mg total) by mouth every 8 (eight) hours as needed for nausea or vomiting. 04/05/21   Henderly, Britni A, PA-C  phenazopyridine (PYRIDIUM) 95 MG tablet Take 1 tablet (95 mg total) by mouth 3 (three) times daily as needed for pain. 04/05/21   Henderly, Britni A, PA-C    Allergies Patient has no known allergies.  Family History  Adopted: Yes  Problem Relation Age of Onset   Alcohol abuse Mother    Thyroid disease Other     Social History Social History   Tobacco Use   Smoking status: Never   Smokeless tobacco: Never  Vaping Use   Vaping Use: Never used  Substance Use Topics   Alcohol use: No   Drug use: No     Review of Systems  Constitutional: No fever/chills Eyes:  No discharge ENT: No upper respiratory complaints. Respiratory: no cough. No SOB/ use of accessory muscles to breath Gastrointestinal:   No nausea, no vomiting.  No diarrhea.  No constipation. Musculoskeletal: Negative for musculoskeletal pain. Skin: Patient has cellulitis of  right lower extremity.     ____________________________________________   PHYSICAL EXAM:  VITAL SIGNS: ED Triage Vitals  Enc Vitals Group     BP 04/16/21 1713 131/60     Pulse Rate 04/16/21 1713 94     Resp 04/16/21 1713 17     Temp 04/16/21 1713 98.3 F (36.8 C)     Temp Source 04/16/21 1713 Oral     SpO2 04/16/21 1713 100 %     Weight 04/16/21 1714 162 lb (73.5 kg)     Height 04/16/21 1714 4\' 11"  (1.499 m)     Head Circumference --      Peak Flow --       Pain Score 04/16/21 1714 10     Pain Loc --      Pain Edu? --      Excl. in GC? --      Constitutional: Alert and oriented. Well appearing and in no acute distress. Eyes: Conjunctivae are normal. PERRL. EOMI. Head: Atraumatic. ENT: Cardiovascular: Normal rate, regular rhythm. Normal S1 and S2.  Good peripheral circulation. Respiratory: Normal respiratory effort without tachypnea or retractions. Lungs CTAB. Good air entry to the bases with no decreased or absent breath sounds Gastrointestinal: Bowel sounds x 4 quadrants. Soft and nontender to palpation. No guarding or rigidity. No distention. Musculoskeletal: Full range of motion to all extremities. No obvious deformities noted Neurologic:  Normal for age. No gross focal neurologic deficits are appreciated.  Skin: Patient has a 4 cm x 3 cm region of circumferential cellulitis along the lateral aspect of the right lower extremity. Psychiatric: Mood and affect are normal for age. Speech and behavior are normal.   ____________________________________________   LABS (all labs ordered are listed, but only abnormal results are displayed)  Labs Reviewed - No data to display ____________________________________________  EKG   ____________________________________________  RADIOLOGY  No results found.  ____________________________________________    PROCEDURES  Procedure(s) performed:     Procedures     Medications - No data to display   ____________________________________________   INITIAL IMPRESSION / ASSESSMENT AND PLAN / ED COURSE  Pertinent labs & imaging results that were available during my care of the patient were reviewed by me and considered in my medical decision making (see chart for details).      Assessment and plan Cellulitis 30 year old female presents to the emergency department with cellulitis of the right lower extremity.  Patient was discharged with Keflex, Bactrim and topical mupirocin.   Return precautions were given to return with new or worsening symptoms if symptoms do not seem to be improving at home after 2 to 3 days.     ____________________________________________  FINAL CLINICAL IMPRESSION(S) / ED DIAGNOSES  Final diagnoses:  Cellulitis of right lower extremity      NEW MEDICATIONS STARTED DURING THIS VISIT:  ED Discharge Orders          Ordered    sulfamethoxazole-trimethoprim (BACTRIM DS) 800-160 MG tablet  2 times daily        04/16/21 1744    cephALEXin (KEFLEX) 500 MG capsule  3 times daily        04/16/21 1744    mupirocin cream (BACTROBAN) 2 %  2 times daily        04/16/21 1744                This chart was dictated using voice recognition software/Dragon. Despite best efforts to proofread, errors can occur which can change the meaning. Any change  was purely unintentional.     Orvil Feil, PA-C 04/16/21 1748    Delton Prairie, MD 04/22/21 0700

## 2021-04-16 NOTE — ED Triage Notes (Signed)
Pt comes into the ED via POV c/o insect bite to the left calf.  Pt has redness and cellulitis present around the bite.  Pt states she noticed it on Tuesday of this week.  Pt ambulatory to triage.  Denies any fevers at home that she is aware of.  Pt does admit to seeing purulent drainage come out of the wound.

## 2021-04-19 ENCOUNTER — Other Ambulatory Visit: Payer: Self-pay

## 2021-04-19 ENCOUNTER — Emergency Department (HOSPITAL_COMMUNITY)
Admission: EM | Admit: 2021-04-19 | Discharge: 2021-04-19 | Disposition: A | Discharge status: Home / Self Care | Payer: Self-pay | Attending: Emergency Medicine | Admitting: Emergency Medicine

## 2021-04-19 ENCOUNTER — Encounter (HOSPITAL_COMMUNITY): Payer: Self-pay

## 2021-04-19 DIAGNOSIS — L03115 Cellulitis of right lower limb: Secondary | ICD-10-CM | POA: Insufficient documentation

## 2021-04-19 MED ORDER — CLINDAMYCIN HCL 150 MG PO CAPS: 300.0000 mg | ORAL_CAPSULE | Freq: Three times a day (TID) | ORAL | 0 refills | Status: AC

## 2021-04-19 MED ORDER — CLINDAMYCIN HCL 150 MG PO CAPS
300.0000 mg | ORAL_CAPSULE | Freq: Once | ORAL | Status: AC
  Administered 2021-04-19: 300 mg via ORAL
  Filled 2021-04-19: qty 2

## 2021-04-19 NOTE — ED Provider Notes (Signed)
Emergency Medicine Provider Triage Evaluation Note  Karilyn Cota , a 30 y.o. female  was evaluated in triage.  Patient reports a spider bite to her her right calf on Monday.  She was messing in some boxes and had a bunch of spiders and was bitten.  She reports the area turning erythematous shortly after.  He has gradually worsened and has not purulent drainage since then.  He denies any fevers or chills or other systemic symptoms.  Review of Systems  Positive: Erythema, abscess Negative: Leg swelling, or, chills  Physical Exam  BP (!) 152/77 (BP Location: Right Arm)   Pulse 88   Temp 98.8 F (37.1 C)   Resp 17   LMP 03/31/2021 (Exact Date)   SpO2 98%  Gen:   Awake, no distress   Resp:  Normal effort  MSK:   Moves extremities without difficulty  Other:  Area to right calf with round erythematous area approximately 3 x 3 cm.  It is fluctuant at the center.  No active drainage from the area.  May be drainable.  Medical Decision Making  Medically screening exam initiated at 12:29 PM.  Appropriate orders placed.  Pierra S Scheerer was informed that the remainder of the evaluation will be completed by another provider, this initial triage assessment does not replace that evaluation, and the importance of remaining in the ED until their evaluation is complete.  With no systemic symptoms.  May need I&D in the back.  Will probably need antibiotics for the area.  Do not think she needs labs at this time   Therese Sarah 04/19/21 1231    Margarita Grizzle, MD 04/20/21 [phone]

## 2021-04-19 NOTE — Discharge Instructions (Signed)
Stop the Keflex and Bactrim, and start the clindamycin. Take the antibiotics as prescribed.  Take the entire course, even if symptoms improve. Use warm compresses 3 times a day for 20 minutes at a time to help with pain and swelling. Do not squeeze or poke at the area. Rich follow-up with your primary care doctor as needed for recheck. Return to the emergency room with any new, worsening, concerning symptoms

## 2021-04-19 NOTE — ED Triage Notes (Signed)
Patient complains of pain and redness to right lower leg after reported spider bite on Monday. Reports some drainage from same.

## 2021-04-19 NOTE — ED Provider Notes (Signed)
[facility] EMERGENCY DEPARTMENT Provider Note   CSN: [id number] Arrival date & time: 04/19/21  1051     History No chief complaint on file.   [name] is a 30 y.o. female presenting for evaluation of right leg pain and redness.  Patient states 5 days ago she developed pain and redness of her right proximal calf.  She was seen at another ER, put on Keflex and Bactrim.  However she is concerned that this is a spider bite, states her symptoms are worsening, not improving.  She is pressing on the area to express pus, but no drainage without expression.  No fevers or chills.  No confusion or weakness.  No nausea or vomiting.  She is no medical problems, takes medications daily.  She is taking naproxen for pain, not taking anything else.  She is not using any warm or cool compresses. Nothing makes her sxs better or worse.   HPI     History reviewed. No pertinent past medical history.  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   Blood type O+ 12/08/2018   Irregular contractions 04/18/2015   Postoperative care for cataract 04/18/2015    Past Surgical History:  Procedure Laterality Date   CESAREAN SECTION     CESAREAN SECTION N/A    CESAREAN SECTION N/A 04/18/2015   Procedure: CESAREAN SECTION;  Surgeon: Suzy Bouchard, MD;  Location: [facility] ORS;  Service: Obstetrics;  Laterality: N/A;     OB History     Gravida  4   Para  3   Term  1   Preterm  1   AB  1   Living  3      SAB  1   IAB  0   Ectopic  0   Multiple  0   Live Births  3           Family History  Adopted: Yes  Problem Relation Age of Onset   Alcohol abuse Mother    Thyroid disease Other     Social History   Tobacco Use   Smoking status: Never   Smokeless tobacco: Never  Vaping Use   Vaping Use: Never used  Substance Use Topics   Alcohol use: No    Drug use: No    Home Medications Prior to Admission medications   Medication Sig Start Date End Date Taking? Authorizing Provider  clindamycin (CLEOCIN) 150 MG capsule Take 2 capsules (300 mg total) by mouth 3 (three) times daily for 7 days. 04/19/21 04/26/21 Yes Princella Jaskiewicz, PA-C  benzonatate (TESSALON) 100 MG capsule Take 1 capsule (100 mg total) by mouth every 8 (eight) hours. 04/05/21   Henderly, Britni A, PA-C  cephALEXin (KEFLEX) 500 MG capsule Take 1 capsule (500 mg total) by mouth 3 (three) times daily for 10 days. 04/16/21 04/26/21  Orvil Feil, PA-C  fluconazole (DIFLUCAN) 150 MG tablet Take 1 tablet (150 mg total) by mouth daily. 04/05/21   Henderly, Britni A, PA-C  mupirocin cream (BACTROBAN) 2 % Apply 1 application topically 2 (two) times daily for 7 days. Apply twice daily for the next seven days. 04/16/21 04/23/21  Orvil Feil, PA-C  naproxen (NAPROSYN) 500 MG tablet Take 1 tablet (500 mg total) by mouth 2 (two) times daily. 04/05/21   Henderly, Britni A, PA-C  ondansetron (ZOFRAN ODT) 4 MG disintegrating tablet Take  1 tablet (4 mg total) by mouth every 8 (eight) hours as needed for nausea or vomiting. 04/05/21   Henderly, Britni A, PA-C  phenazopyridine (PYRIDIUM) 95 MG tablet Take 1 tablet (95 mg total) by mouth 3 (three) times daily as needed for pain. 04/05/21   Henderly, Britni A, PA-C  sulfamethoxazole-trimethoprim (BACTRIM DS) 800-160 MG tablet Take 1 tablet by mouth 2 (two) times daily for 7 days. 04/16/21 04/23/21  Orvil Feil, PA-C    Allergies    Patient has no known allergies.  Review of Systems   Review of Systems  Skin:  Positive for color change and wound.  All other systems reviewed and are negative.  Physical Exam Updated Vital Signs BP (!) 145/74   Pulse 85   Temp 98.1 F (36.7 C)   Resp 16   LMP 03/31/2021 (Exact Date)   SpO2 100%   Physical Exam Vitals and nursing note reviewed.  Constitutional:      General: She is not in acute  distress.    Appearance: Normal appearance.     Comments: Resting in the bed in NAD  HENT:     Head: Normocephalic and atraumatic.  Eyes:     Conjunctiva/sclera: Conjunctivae normal.     Pupils: Pupils are equal, round, and reactive to light.  Cardiovascular:     Rate and Rhythm: Normal rate and regular rhythm.     Pulses: Normal pulses.  Pulmonary:     Effort: Pulmonary effort is normal. No respiratory distress.     Breath sounds: Normal breath sounds. No wheezing.     Comments: Speaking in full sentences.  Clear lung sounds in all fields. Abdominal:     General: There is no distension.     Palpations: Abdomen is soft.     Tenderness: There is no abdominal tenderness.  Musculoskeletal:        General: Normal range of motion.     Cervical back: Normal range of motion and neck supple.       Legs:     Comments: Erythema, warmth, and tenderness of the right medial proximal calf.  Central scabbed area.  Mild induration.  No active drainage.  No streaking.  Skin:    General: Skin is warm and dry.     Capillary Refill: Capillary refill takes less than 2 seconds.  Neurological:     Mental Status: She is alert and oriented to person, place, and time.  Psychiatric:        Mood and Affect: Mood and affect normal.        Speech: Speech normal.        Behavior: Behavior normal.    ED Results / Procedures / Treatments   Labs (all labs ordered are listed, but only abnormal results are displayed) Labs Reviewed - No data to display  EKG None  Radiology No results found.  Bedside US without fluid collection or abscess. Shows cobblestoning to tissue c/w edema.   Procedures Procedures   Medications Ordered in ED Medications  clindamycin (CLEOCIN) capsule 300 mg (300 mg Oral Given 04/19/21 1703)    ED Course  I have reviewed the triage vital signs and the nursing notes.  Pertinent labs & imaging results that were available during my care of the patient were reviewed by me and  considered in my medical decision making (see chart for details).    MDM Rules/Calculators/A&P  Patient presenting for evaluation of lesion of the right lower leg.  On exam, patient peers nontoxic.  Vital signs are reassuring, exam not consistent with sepsis.  There is lesion of the leg which is consistent with infection.  Per patient, was worsening on antibiotics.  Bedside ultrasound does not reveal any drainable abscess, although does show edema of the tissues/cobblestoning.  Discussed findings with patient.  Discussed switching patient's antibiotics to clindamycin.  Discussed use of warm compresses and continued monitoring.  At this time, patient appears safe for discharge.  Return precautions given.  Patient states she understands and agrees to plan.   Final Clinical Impression(s) / ED Diagnoses Final diagnoses:  Cellulitis of right leg    Rx / DC Orders ED Discharge Orders          Ordered    clindamycin (CLEOCIN) 150 MG capsule  3 times daily        04/19/21 1646             Jamyah Folk, PA-C 04/19/21 1707    Linwood Dibbles, MD 04/20/21 1316

## 2021-06-17 ENCOUNTER — Encounter: Payer: Self-pay | Admitting: Emergency Medicine

## 2021-06-17 ENCOUNTER — Other Ambulatory Visit: Payer: Self-pay

## 2021-06-17 ENCOUNTER — Emergency Department
Admission: EM | Admit: 2021-06-17 | Discharge: 2021-06-17 | Disposition: A | Discharge status: Home / Self Care | Payer: Self-pay | Attending: Emergency Medicine | Admitting: Emergency Medicine

## 2021-06-17 ENCOUNTER — Emergency Department: Payer: Self-pay

## 2021-06-17 DIAGNOSIS — R1031 Right lower quadrant pain: Secondary | ICD-10-CM | POA: Insufficient documentation

## 2021-06-17 DIAGNOSIS — R1030 Lower abdominal pain, unspecified: Secondary | ICD-10-CM

## 2021-06-17 DIAGNOSIS — R102 Pelvic and perineal pain: Secondary | ICD-10-CM | POA: Insufficient documentation

## 2021-06-17 LAB — CBC WITH DIFFERENTIAL/PLATELET
Abs Immature Granulocytes: 0.04 10*3/uL (ref 0.00–0.07)
Basophils Absolute: 0 10*3/uL (ref 0.0–0.1)
Basophils Relative: 0 %
Eosinophils Absolute: 0.1 10*3/uL (ref 0.0–0.5)
Eosinophils Relative: 1 %
HCT: 39.1 % (ref 36.0–46.0)
Hemoglobin: 13.1 g/dL (ref 12.0–15.0)
Immature Granulocytes: 1 %
Lymphocytes Relative: 20 %
Lymphs Abs: 1.5 10*3/uL (ref 0.7–4.0)
MCH: 31.6 pg (ref 26.0–34.0)
MCHC: 33.5 g/dL (ref 30.0–36.0)
MCV: 94.2 fL (ref 80.0–100.0)
Monocytes Absolute: 0.5 10*3/uL (ref 0.1–1.0)
Monocytes Relative: 7 %
Neutro Abs: 5.5 10*3/uL (ref 1.7–7.7)
Neutrophils Relative %: 71 %
Platelets: 298 10*3/uL (ref 150–400)
RBC: 4.15 MIL/uL (ref 3.87–5.11)
RDW: 11.3 % — ABNORMAL LOW (ref 11.5–15.5)
WBC: 7.7 10*3/uL (ref 4.0–10.5)
nRBC: 0 % (ref 0.0–0.2)

## 2021-06-17 LAB — URINALYSIS, ROUTINE W REFLEX MICROSCOPIC
Bilirubin Urine: NEGATIVE
Glucose, UA: NEGATIVE mg/dL
Ketones, ur: NEGATIVE mg/dL
Leukocytes,Ua: NEGATIVE
Nitrite: NEGATIVE
Protein, ur: NEGATIVE mg/dL
Specific Gravity, Urine: 1.013 (ref 1.005–1.030)
pH: 6 (ref 5.0–8.0)

## 2021-06-17 LAB — BASIC METABOLIC PANEL
Anion gap: 7 (ref 5–15)
BUN: 18 mg/dL (ref 6–20)
CO2: 24 mmol/L (ref 22–32)
Calcium: 8.9 mg/dL (ref 8.9–10.3)
Chloride: 104 mmol/L (ref 98–111)
Creatinine, Ser: 0.7 mg/dL (ref 0.44–1.00)
GFR, Estimated: >60 mL/min (ref >60)
Glucose, Bld: 106 mg/dL — ABNORMAL HIGH (ref 70–99)
Potassium: 4.4 mmol/L (ref 3.5–5.1)
Sodium: 135 mmol/L (ref 135–145)

## 2021-06-17 LAB — POC URINE PREG, ED: Preg Test, Ur: NEGATIVE

## 2021-06-17 MED ORDER — IBUPROFEN 800 MG PO TABS: 800.0000 mg | ORAL_TABLET | Freq: Three times a day (TID) | ORAL | 0 refills | Status: DC | PRN

## 2021-06-17 MED ORDER — IBUPROFEN 600 MG PO TABS
600.0000 mg | ORAL_TABLET | Freq: Once | ORAL | Status: AC
  Administered 2021-06-17: 600 mg via ORAL
  Filled 2021-06-17: qty 1

## 2021-06-17 MED ORDER — IOHEXOL 300 MG/ML  SOLN
100.0000 mL | Freq: Once | INTRAMUSCULAR | Status: AC | PRN
  Administered 2021-06-17: 100 mL via INTRAVENOUS
  Filled 2021-06-17: qty 100

## 2021-06-17 MED ORDER — OXYCODONE-ACETAMINOPHEN 5-325 MG PO TABS
1.0000 | ORAL_TABLET | Freq: Once | ORAL | Status: DC
  Filled 2021-06-17: qty 1

## 2021-06-17 NOTE — ED Triage Notes (Signed)
Pt here with pelvic pain that started yesterday and vaginal bleeding starting today. Pt in NAD in triage.

## 2021-06-17 NOTE — ED Notes (Signed)
First Nurse Note:  Pt ambulatory into ED stating that she is having pelvic pain and vaginal bleeding. Pt is in NAD.

## 2021-06-17 NOTE — Discharge Instructions (Signed)
Follow-up with your regular doctor as needed.  Return emergency department worsening.  Take the ibuprofen and Tylenol for pain as needed.

## 2021-06-17 NOTE — ED Provider Notes (Signed)
[facility] Provider Note    Event Date/Time   First MD Initiated Contact with Patient 06/17/21 1022     (approximate)   History   Pelvic Pain   HPI  [name] is a 31 y.o. female with history of ovarian cyst presents emergency department with bilateral pelvic pain, worsening on the left, patient has Nexplanon implant in her arm, LMP was January 5.  Pain started this morning.  States she is also having a light spotting type vaginal bleeding associated with pain.  No fever or chills.  No vaginal discharge.      Physical Exam   Triage Vital Signs: ED Triage Vitals  Enc Vitals Group     BP 06/17/21 1003 134/81     Pulse Rate 06/17/21 1003 86     Resp 06/17/21 1003 17     Temp 06/17/21 1003 98.1 F (36.7 C)     Temp Source 06/17/21 1003 Oral     SpO2 06/17/21 1003 96 %     Weight 06/17/21 1004 168 lb (76.2 kg)     Height 06/17/21 1004 5' (1.524 m)     Head Circumference --      Peak Flow --      Pain Score 06/17/21 1003 8     Pain Loc --      Pain Edu? --      Excl. in GC? --     Most recent vital signs: Vitals:   06/17/21 1003  BP: 134/81  Pulse: 86  Resp: 17  Temp: 98.1 F (36.7 C)  SpO2: 96%     General: Awake, no distress.    CV:  Good peripheral perfusion.  regular rate and  rhythm Resp:  Normal effort. Lungs CTA Abd:  No distention.  Tender in the right and left lower quadrant Other:      ED Results / Procedures / Treatments   Labs (all labs ordered are listed, but only abnormal results are displayed) Labs Reviewed  BASIC METABOLIC PANEL - Abnormal; Notable for the following components:      Result Value   Glucose, Bld 106 (*)    All other components within normal limits  CBC WITH DIFFERENTIAL/PLATELET - Abnormal; Notable for the following components:   RDW 11.3 (*)    All other components within normal limits  URINALYSIS, ROUTINE W REFLEX MICROSCOPIC - Abnormal; Notable for the following components:   Color,  Urine STRAW (*)    APPearance CLEAR (*)    Hgb urine dipstick MODERATE (*)    Bacteria, UA RARE (*)    All other components within normal limits  POC URINE PREG, ED     EKG     RADIOLOGY Ultrasound pelvis, ultrasound for torsion    PROCEDURES:   Procedures   MEDICATIONS ORDERED IN ED: Medications  ibuprofen (ADVIL) tablet 600 mg (600 mg Oral Given 06/17/21 1113)  iohexol (OMNIPAQUE) 300 MG/ML solution 100 mL (100 mLs Intravenous Contrast Given 06/17/21 1159)     IMPRESSION / MDM / ASSESSMENT AND PLAN / ED COURSE  I reviewed the triage vital signs and the nursing notes.                              Differential diagnosis includes, but is not limited to, ovarian cyst, ruptured ovarian cyst, ovarian torsion, PID, ectopic, appendicitis, diverticulitis  POC pregnancy test is negative, therefore do not feel that patient has  an ectopic  Ultrasound reviewed by me does not show any acute abnormality.  Radiologist read is negative for ovarian cyst, normal exam  Due to the patient's right lower quadrant pain I explained to her I do not feel comfortable letting her go without doing a CT scan for appendicitis.  Labs and imaging ordered  Labs are reassuring, CBC, metabolic panel and UA are all normal  CT abdomen/pelvis with IV contrast was reviewed by me.  Radiologist read as negative for any acute abnormality.  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  States this is not acute  I did explain these findings to the patient.  Explained to her unsure of why she is having abdominal pain.  She does not feel that she has an STD she has had no vaginal discharge.  She would like to defer the STD testing.  Patient was instructed to take ibuprofen for pain.  She does not want a narcotic.  There is no sign of infection so do not feel that she needs antibiotic.  Patient is very stable so do not think she requires admission.  She was discharged in stable condition.  Strict  instructions to return if worsening      FINAL CLINICAL IMPRESSION(S) / ED DIAGNOSES   Final diagnoses:  Pelvic pain  Lower abdominal pain     Rx / DC Orders   ED Discharge Orders          Ordered    ibuprofen (ADVIL) 800 MG tablet  Every 8 hours PRN        06/17/21 1234             Note:  This document was prepared using Dragon voice recognition software and may include unintentional dictation errors.    Faythe Ghee, PA-C 06/17/21 1419    Minna Antis, MD 06/17/21 1431

## 2022-02-20 ENCOUNTER — Emergency Department (HOSPITAL_COMMUNITY)
Admission: EM | Admit: 2022-02-20 | Discharge: 2022-02-20 | Disposition: A | Discharge status: Home / Self Care | Payer: Self-pay | Attending: Emergency Medicine | Admitting: Emergency Medicine

## 2022-02-20 ENCOUNTER — Emergency Department (HOSPITAL_COMMUNITY): Payer: Self-pay

## 2022-02-20 ENCOUNTER — Other Ambulatory Visit: Payer: Self-pay

## 2022-02-20 ENCOUNTER — Encounter (HOSPITAL_COMMUNITY): Payer: Self-pay

## 2022-02-20 DIAGNOSIS — J40 Bronchitis, not specified as acute or chronic: Secondary | ICD-10-CM | POA: Insufficient documentation

## 2022-02-20 DIAGNOSIS — Z20822 Contact with and (suspected) exposure to covid-19: Secondary | ICD-10-CM | POA: Insufficient documentation

## 2022-02-20 LAB — RESP PANEL BY RT-PCR (FLU A&B, COVID) ARPGX2
Influenza A by PCR: NEGATIVE
Influenza B by PCR: NEGATIVE
SARS Coronavirus 2 by RT PCR: NEGATIVE

## 2022-02-20 MED ORDER — BENZONATATE 100 MG PO CAPS: 100.0000 mg | ORAL_CAPSULE | Freq: Three times a day (TID) | ORAL | 0 refills | Status: DC

## 2022-02-20 MED ORDER — AZITHROMYCIN 250 MG PO TABS: ORAL_TABLET | ORAL | 0 refills | Status: AC

## 2022-02-20 MED ORDER — PREDNISONE 10 MG PO TABS: 20.0000 mg | ORAL_TABLET | Freq: Every day | ORAL | 0 refills | Status: AC

## 2022-02-20 NOTE — Discharge Instructions (Addendum)
You were seen in the emergency department today for ongoing cough and likely bronchitis.  I am prescribing you cough medication called Tessalon that you can use as needed for coughing.  I am also prescribing you a Z-Pak which is azithromycin, an antibiotic.  I am also prescribing you 5 days of steroids.  Please note that bronchitis is often viral and can have long-lasting cough.  Please try taking the Tessalon first to see if this provides you relief before you are using the antibiotics or steroids.  These return for significantly worsening symptoms such as shortness of breath or difficulty breathing.

## 2022-02-20 NOTE — ED Provider Notes (Signed)
[facility] EMERGENCY DEPARTMENT Provider Note   CSN: [id number] Arrival date & time: 02/20/22  1707     History  Chief Complaint  Patient presents with   Cough    [name] is a 31 y.o. female. With no significant past medical history who presents to the emergency department with cough.  States she has had cough for about 1 week.  She describes it as a harsh dry cough.  She occasionally has clear mucus production.  States that she was seen by primary care provider who gave her codeine.  States that this is just put her to sleep but not improved her cough symptoms.  She is taking NyQuil/DayQuil outside of the codeine.  She denies having shortness of breath or chest pain or palpitations.  Denies body aches, congestion, headache or fever.  Denies recent travel.  Non-smoker.   Cough      Home Medications Prior to Admission medications   Medication Sig Start Date End Date Taking? Authorizing Provider  azithromycin (ZITHROMAX Z-PAK) 250 MG tablet Take 2 tablets (500 mg total) by mouth daily for 1 day, THEN 1 tablet (250 mg total) daily for 4 days. 02/20/22 02/25/22 Yes Mickie Hillier, PA-C  benzonatate (TESSALON) 100 MG capsule Take 1 capsule (100 mg total) by mouth every 8 (eight) hours. 02/20/22  Yes Mickie Hillier, PA-C  predniSONE (DELTASONE) 10 MG tablet Take 2 tablets (20 mg total) by mouth daily for 5 days. 02/20/22 02/25/22 Yes Mickie Hillier, PA-C  ibuprofen (ADVIL) 800 MG tablet Take 1 tablet (800 mg total) by mouth every 8 (eight) hours as needed. 06/17/21   Versie Starks, PA-C      Allergies    Patient has no known allergies.    Review of Systems   Review of Systems  Respiratory:  Positive for cough.   All other systems reviewed and are negative.   Physical Exam Updated Vital Signs BP (!) 145/80 (BP Location: Right Arm)   Pulse 88   Temp 98 F (36.7 C) (Oral)   Resp 16   Ht 4\' 11"  (1.499 m)   Wt 77.1 kg   SpO2 93%   BMI 34.34 kg/m  Physical  Exam Vitals and nursing note reviewed.  Constitutional:      General: She is not in acute distress.    Appearance: Normal appearance. She is not toxic-appearing.  HENT:     Head: Normocephalic and atraumatic.     Nose: Nose normal. No congestion or rhinorrhea.     Mouth/Throat:     Mouth: Mucous membranes are moist.     Pharynx: Posterior oropharyngeal erythema present.  Eyes:     General: No scleral icterus.    Extraocular Movements: Extraocular movements intact.     Pupils: Pupils are equal, round, and reactive to light.  Cardiovascular:     Rate and Rhythm: Normal rate.     Pulses: Normal pulses.     Heart sounds: No murmur heard. Pulmonary:     Effort: Pulmonary effort is normal. No respiratory distress.     Breath sounds: No stridor. No wheezing, rhonchi or rales.  Musculoskeletal:     Cervical back: No tenderness.  Lymphadenopathy:     Cervical: Cervical adenopathy present.  Skin:    Findings: No rash.  Neurological:     General: No focal deficit present.     Mental Status: She is alert and oriented to person, place, and time. Mental status is at baseline.  Psychiatric:  Mood and Affect: Mood normal.        Behavior: Behavior normal.        Thought Content: Thought content normal.        Judgment: Judgment normal.    ED Results / Procedures / Treatments   Labs (all labs ordered are listed, but only abnormal results are displayed) Labs Reviewed  RESP PANEL BY RT-PCR (FLU A&B, COVID) [id number]    EKG None  Radiology DG Chest 2 View  Result Date: 02/20/2022 CLINICAL DATA:  Rib pain with cough EXAM: CHEST - 2 VIEW COMPARISON:  None Available. FINDINGS: The heart size and mediastinal contours are within normal limits. Both lungs are clear. The visualized skeletal structures are unremarkable. IMPRESSION: No active cardiopulmonary disease. Electronically Signed   By: Ronney Asters M.D.   On: 02/20/2022 17:34    Procedures Procedures    Medications Ordered in  ED Medications - No data to display  ED Course/ Medical Decision Making/ A&P                           Medical Decision Making Amount and/or Complexity of Data Reviewed Radiology: ordered.  This patient presents to the ED with chief complaint(s) of cough with pertinent past medical history of none which further complicates the presenting complaint. The complaint involves an extensive differential diagnosis and also carries with it a high risk of complications and morbidity.    The differential diagnosis includes viral URI with cough, bronchitis, COPD, asthma, CHF, medication side effect, post-nasal drip, etc.  Additional history obtained: Additional history obtained from  none available Records reviewed Care Everywhere/External Records  ED Course and Reassessment:   Independent labs interpretation:  The following labs were independently interpreted: COVID and flu negative  Independent visualization of imaging: - I independently visualized the following imaging with scope of interpretation limited to determining acute life threatening conditions related to emergency care: CXR, which revealed no acute abnormalities  Consultation: - Consulted or discussed management/test interpretation w/ external professional: not indicated   Consideration for admission or further workup: not indicated  Social Determinants of health: none identified  Final Clinical Impression(s) / ED Diagnoses Final diagnoses:  Bronchitis    Rx / DC Orders ED Discharge Orders          Ordered    benzonatate (TESSALON) 100 MG capsule  Every 8 hours        02/20/22 2014    predniSONE (DELTASONE) 10 MG tablet  Daily        02/20/22 2014    azithromycin (ZITHROMAX Z-PAK) 250 MG tablet  Daily        02/20/22 2014

## 2022-02-20 NOTE — ED Triage Notes (Signed)
Patient c/o productive cough for about one week with clear mucous. Seen at PCP on Tuesday and given Rx that has not been helping.

## 2022-02-20 NOTE — ED Notes (Signed)
ED Provider at bedside. 

## 2022-03-10 ENCOUNTER — Other Ambulatory Visit: Payer: Self-pay

## 2022-03-10 ENCOUNTER — Emergency Department (HOSPITAL_COMMUNITY): Payer: Self-pay

## 2022-03-10 ENCOUNTER — Emergency Department (HOSPITAL_COMMUNITY)
Admission: EM | Admit: 2022-03-10 | Discharge: 2022-03-10 | Disposition: A | Discharge status: Home / Self Care | Payer: Self-pay | Attending: Emergency Medicine | Admitting: Emergency Medicine

## 2022-03-10 ENCOUNTER — Encounter (HOSPITAL_COMMUNITY): Payer: Self-pay | Admitting: *Deleted

## 2022-03-10 DIAGNOSIS — R1032 Left lower quadrant pain: Secondary | ICD-10-CM | POA: Insufficient documentation

## 2022-03-10 DIAGNOSIS — R1012 Left upper quadrant pain: Secondary | ICD-10-CM | POA: Insufficient documentation

## 2022-03-10 DIAGNOSIS — R109 Unspecified abdominal pain: Secondary | ICD-10-CM

## 2022-03-10 LAB — CBC WITH DIFFERENTIAL/PLATELET
Abs Immature Granulocytes: 0.04 10*3/uL (ref 0.00–0.07)
Basophils Absolute: 0 10*3/uL (ref 0.0–0.1)
Basophils Relative: 1 %
Eosinophils Absolute: 0.1 10*3/uL (ref 0.0–0.5)
Eosinophils Relative: 1 %
HCT: 41.3 % (ref 36.0–46.0)
Hemoglobin: 13.8 g/dL (ref 12.0–15.0)
Immature Granulocytes: 1 %
Lymphocytes Relative: 19 %
Lymphs Abs: 1.5 10*3/uL (ref 0.7–4.0)
MCH: 31.4 pg (ref 26.0–34.0)
MCHC: 33.4 g/dL (ref 30.0–36.0)
MCV: 93.9 fL (ref 80.0–100.0)
Monocytes Absolute: 0.6 10*3/uL (ref 0.1–1.0)
Monocytes Relative: 8 %
Neutro Abs: 5.7 10*3/uL (ref 1.7–7.7)
Neutrophils Relative %: 70 %
Platelets: 264 10*3/uL (ref 150–400)
RBC: 4.4 MIL/uL (ref 3.87–5.11)
RDW: 11.7 % (ref 11.5–15.5)
WBC: 7.9 10*3/uL (ref 4.0–10.5)
nRBC: 0 % (ref 0.0–0.2)

## 2022-03-10 LAB — COMPREHENSIVE METABOLIC PANEL
ALT: 14 U/L (ref 0–44)
AST: 15 U/L (ref 15–41)
Albumin: 3.5 g/dL (ref 3.5–5.0)
Alkaline Phosphatase: 83 U/L (ref 38–126)
Anion gap: 5 (ref 5–15)
BUN: 11 mg/dL (ref 6–20)
CO2: 24 mmol/L (ref 22–32)
Calcium: 8.7 mg/dL — ABNORMAL LOW (ref 8.9–10.3)
Chloride: 109 mmol/L (ref 98–111)
Creatinine, Ser: 0.68 mg/dL (ref 0.44–1.00)
GFR, Estimated: >60 mL/min (ref >60)
Glucose, Bld: 67 mg/dL — ABNORMAL LOW (ref 70–99)
Potassium: 3.6 mmol/L (ref 3.5–5.1)
Sodium: 138 mmol/L (ref 135–145)
Total Bilirubin: 1.5 mg/dL — ABNORMAL HIGH (ref 0.3–1.2)
Total Protein: 7.3 g/dL (ref 6.5–8.1)

## 2022-03-10 LAB — URINALYSIS, ROUTINE W REFLEX MICROSCOPIC
Bilirubin Urine: NEGATIVE
Glucose, UA: NEGATIVE mg/dL
Ketones, ur: NEGATIVE mg/dL
Leukocytes,Ua: NEGATIVE
Nitrite: NEGATIVE
Protein, ur: NEGATIVE mg/dL
Specific Gravity, Urine: 1.017 (ref 1.005–1.030)
pH: 6 (ref 5.0–8.0)

## 2022-03-10 LAB — I-STAT BETA HCG BLOOD, ED (MC, WL, AP ONLY): I-stat hCG, quantitative: <5 m[IU]/mL (ref <5)

## 2022-03-10 LAB — LIPASE, BLOOD: Lipase: 33 U/L (ref 11–51)

## 2022-03-10 MED ORDER — ONDANSETRON 4 MG PO TBDP: 4.0000 mg | ORAL_TABLET | Freq: Three times a day (TID) | ORAL | 0 refills | Status: DC | PRN

## 2022-03-10 MED ORDER — DICYCLOMINE HCL 20 MG PO TABS: 20.0000 mg | ORAL_TABLET | Freq: Two times a day (BID) | ORAL | 0 refills | Status: DC

## 2022-03-10 MED ORDER — ACETAMINOPHEN 500 MG PO TABS
1000.0000 mg | ORAL_TABLET | Freq: Once | ORAL | Status: AC
Start: 2022-03-10 — End: 2022-03-10
  Administered 2022-03-10: 1000 mg via ORAL
  Filled 2022-03-10: qty 2

## 2022-03-10 MED ORDER — NAPROXEN 500 MG PO TABS: 500.0000 mg | ORAL_TABLET | Freq: Two times a day (BID) | ORAL | 0 refills | Status: DC

## 2022-03-10 NOTE — ED Provider Notes (Signed)
[facility] EMERGENCY DEPARTMENT Provider Note   CSN: [id number] Arrival date & time: 03/10/22  0827     History  Chief Complaint  Patient presents with   Abdominal Pain    [name] is a 31 y.o. female.  Patient with history of cesarean section, ovarian cyst presents to the emergency department for evaluation of left-sided abdominal pain.  Symptoms started 3 to 4 days ago.  Pain has become worse.  She has pain in her left flank.  No vomiting, constipation or diarrhea.  She has had frequent bowel movements but are not watery.  She has been taking Tylenol without improvement.  No dysuria, hematuria, increased frequency or urgency.  No vaginal bleeding or discharge.  She has not had pain like this in the past.  She states that she has had a lot of flatulence.  No other abdominal surgeries.       Home Medications Prior to Admission medications   Medication Sig Start Date End Date Taking? Authorizing Provider  benzonatate (TESSALON) 100 MG capsule Take 1 capsule (100 mg total) by mouth every 8 (eight) hours. 02/20/22   Mickie Hillier, PA-C  ibuprofen (ADVIL) 800 MG tablet Take 1 tablet (800 mg total) by mouth every 8 (eight) hours as needed. 06/17/21   Versie Starks, PA-C      Allergies    Patient has no known allergies.    Review of Systems   Review of Systems  Physical Exam Updated Vital Signs BP 138/84   Pulse 94   Temp 98.7 F (37.1 C) (Oral)   Resp 18   Ht 4\' 11"  (1.499 m)   Wt 77.1 kg   SpO2 100%   BMI 34.34 kg/m   Physical Exam Vitals and nursing note reviewed.  Constitutional:      General: She is not in acute distress.    Appearance: She is well-developed.  HENT:     Head: Normocephalic and atraumatic.     Right Ear: External ear normal.     Left Ear: External ear normal.     Nose: Nose normal.  Eyes:     Conjunctiva/sclera: Conjunctivae normal.  Cardiovascular:     Rate and Rhythm: Normal rate and regular rhythm.     Heart sounds: No murmur  heard. Pulmonary:     Effort: No respiratory distress.     Breath sounds: No wheezing, rhonchi or rales.  Abdominal:     Palpations: Abdomen is soft.     Tenderness: There is abdominal tenderness (Mild to moderate) in the left upper quadrant and left lower quadrant. There is no guarding or rebound.  Musculoskeletal:     Cervical back: Normal range of motion and neck supple.     Right lower leg: No edema.     Left lower leg: No edema.  Skin:    General: Skin is warm and dry.     Findings: No rash.  Neurological:     General: No focal deficit present.     Mental Status: She is alert. Mental status is at baseline.     Motor: No weakness.  Psychiatric:        Mood and Affect: Mood normal.     ED Results / Procedures / Treatments   Labs (all labs ordered are listed, but only abnormal results are displayed) Labs Reviewed  COMPREHENSIVE METABOLIC PANEL - Abnormal; Notable for the following components:      Result Value   Glucose, Bld 67 (*)  Calcium 8.7 (*)    Total Bilirubin 1.5 (*)    All other components within normal limits  URINALYSIS, ROUTINE W REFLEX MICROSCOPIC - Abnormal; Notable for the following components:   APPearance HAZY (*)    Hgb urine dipstick SMALL (*)    Bacteria, UA RARE (*)    All other components within normal limits  CBC WITH DIFFERENTIAL/PLATELET  LIPASE, BLOOD  I-STAT BETA HCG BLOOD, ED ([facility], [facility], [facility] ONLY)    EKG None  Radiology CT Renal Stone Study  Result Date: 03/10/2022 CLINICAL DATA:  31 year old female with 4 days of left flank pain. EXAM: CT ABDOMEN AND PELVIS WITHOUT CONTRAST TECHNIQUE: Multidetector CT imaging of the abdomen and pelvis was performed following the standard protocol without IV contrast. RADIATION DOSE REDUCTION: This exam was performed according to the departmental dose-optimization program which includes automated exposure control, adjustment of the mA and/or kV according to patient size and/or use of iterative  reconstruction technique. COMPARISON:  CT Abdomen and Pelvis 06/17/2021 and earlier. FINDINGS: Lower chest: Lower lung volumes with mild lung base atelectasis more pronounced on the left. No pericardial or pleural effusion. Hepatobiliary: Negative noncontrast liver and gallbladder. Pancreas: Negative. Spleen: Negative. Adrenals/Urinary Tract: Normal adrenal glands. Asymmetric left renal cortical scarring and volume loss is stable. No superimposed nephrolithiasis, hydronephrosis, or acute pararenal inflammation. Both ureters are decompressed. Unremarkable bladder. Occasional punctate pelvic phleboliths with no convincing urinary calculus. Stomach/Bowel: Large bowel retained stool and some redundancy. Normal appendix tracking away from the cecum on coronal image 55. No dilated small bowel. Stomach and duodenum appear negative. No free air or free fluid. Vascular/Lymphatic: Normal caliber abdominal aorta. No calcified atherosclerosis or lymphadenopathy identified. Reproductive: Negative. Other: Small volume of pelvic free fluid is likely physiologic. Musculoskeletal: No acute osseous abnormality identified. IMPRESSION: 1. Chronic left renal cortical scarring and volume loss but no urinary calculus or acute obstructive uropathy. 2. No acute or inflammatory process identified in the non-contrast abdomen. 3. Small volume pelvic free fluid is likely physiologic. Electronically Signed   By: Genevie Ann M.D.   On: 03/10/2022 11:08    Procedures Procedures    Medications Ordered in ED Medications - No data to display  ED Course/ Medical Decision Making/ A&P    Patient seen and examined. History obtained directly from patient.  Reviewed previous CT and ultrasound results.  Labs/EKG: Ordered CBC, CMP, lipase, UA, pregnancy.  Imaging: None ordered  Medications/Fluids: Ordered: Tylenol per patient request.   Most recent vital signs reviewed and are as follows: BP 138/84   Pulse 94   Temp 98.7 F (37.1 C)  (Oral)   Resp 18   Ht 4\' 11"  (1.499 m)   Wt 77.1 kg   SpO2 100%   BMI 34.34 kg/m   Initial impression: Left-sided abdominal pain  12:05 PM Reassessment performed. Patient appears comfortable.  Exam unchanged.  Labs personally reviewed and interpreted including: CBC unremarkable; CMP with slightly low glucose otherwise unremarkable; lipase normal; UA without compelling signs of infection; pregnancy negative.  Imaging personally visualized and interpreted including: Renal protocol CT, agree no acute findings.  Reviewed pertinent lab work and imaging with patient at bedside. Questions answered.   Most current vital signs reviewed and are as follows: BP 138/84   Pulse 94   Temp 98.7 F (37.1 C) (Oral)   Resp 18   Ht 4\' 11"  (1.499 m)   Wt 77.1 kg   LMP 02/02/2022   SpO2 100%   BMI 34.34 kg/m   Plan:  Discharge to home.   Prescriptions written for: Bentyl, naproxen, Zofran.  Other home care instructions discussed: Avoidance of triggers  ED return instructions discussed: The patient was urged to return to the Emergency Department immediately with worsening of current symptoms, worsening abdominal pain, persistent vomiting, blood noted in stools, fever, or any other concerns. The patient verbalized understanding.   Follow-up instructions discussed: Patient encouraged to follow-up with their PCP in 3 days.                           Medical Decision Making Amount and/or Complexity of Data Reviewed Labs: ordered. Radiology: ordered.  Risk OTC drugs. Prescription drug management.   For this patient's complaint of abdominal pain, the following conditions were considered on the differential diagnosis: gastritis/PUD, enteritis/duodenitis, appendicitis, cholelithiasis/cholecystitis, cholangitis, pancreatitis, ruptured viscus, colitis, diverticulitis, small/large bowel obstruction, proctitis, cystitis, pyelonephritis, ureteral colic, aortic dissection, aortic aneurysm. In women,  ectopic pregnancy, pelvic inflammatory disease, ovarian cysts, and tubo-ovarian abscess were also considered. Atypical chest etiologies were also considered including ACS, PE, and pneumonia.   The patient's vital signs, pertinent lab work and imaging were reviewed and interpreted as discussed in the ED course. Hospitalization was considered for further testing, treatments, or serial exams/observation. However as patient is well-appearing, has a stable exam, and reassuring studies today, I do not feel that they warrant admission at this time. This plan was discussed with the patient who verbalizes agreement and comfort with this plan and seems reliable and able to return to the Emergency Department with worsening or changing symptoms.          Final Clinical Impression(s) / ED Diagnoses Final diagnoses:  Left sided abdominal pain    Rx / DC Orders ED Discharge Orders          Ordered    dicyclomine (BENTYL) 20 MG tablet  2 times daily        03/10/22 1202    ondansetron (ZOFRAN-ODT) 4 MG disintegrating tablet  Every 8 hours PRN        03/10/22 1202    naproxen (NAPROSYN) 500 MG tablet  2 times daily        03/10/22 1202              Carlisle Cater, PA-C 03/10/22 1207    Audley Hose, MD 03/10/22 2040

## 2022-03-10 NOTE — ED Notes (Signed)
Pt ambulatory to waiting room. Pt verbalized understanding of discharge instructions.   

## 2022-03-10 NOTE — Discharge Instructions (Addendum)
Please read and follow all provided instructions.  Your diagnoses today include:  1. Left sided abdominal pain     Tests performed today include: Blood cell counts and platelets: No problems noted Kidney and liver function tests: Slightly low glucose in the blood Pancreas function test (called lipase): Was normal Urine test to look for infection: No signs of severe urine infection A blood or urine test for pregnancy (women only): Negative CT of the abdomen and pelvis: No acute findings Vital signs. See below for your results today.   Medications prescribed:  Bentyl - medication for intestinal cramps and spasms  Naproxen - anti-inflammatory pain medication Do not exceed 500mg  naproxen every 12 hours, take with food  You have been prescribed an anti-inflammatory medication or NSAID. Take with food. Take smallest effective dose for the shortest duration needed for your pain. Stop taking if you experience stomach pain or vomiting.   Zofran (ondansetron) - for nausea and vomiting  Take any prescribed medications only as directed.  Home care instructions:  Follow any educational materials contained in this packet.  Follow-up instructions: Please follow-up with your primary care provider in the next 3 days for further evaluation of your symptoms.    Return instructions:  SEEK IMMEDIATE MEDICAL ATTENTION IF: The pain does not go away or becomes severe  A temperature above 101F develops  Repeated vomiting occurs (multiple episodes)  The pain becomes localized to portions of the abdomen. The right side could possibly be appendicitis. In an adult, the left lower portion of the abdomen could be colitis or diverticulitis.  Blood is being passed in stools or vomit (bright red or black tarry stools)  You develop chest pain, difficulty breathing, dizziness or fainting, or become confused, poorly responsive, or inconsolable (young children) If you have any other emergent concerns regarding  your health  Additional Information: Abdominal (belly) pain can be caused by many things. Your caregiver performed an examination and possibly ordered blood/urine tests and imaging (CT scan, x-rays, ultrasound). Many cases can be observed and treated at home after initial evaluation in the emergency department. Even though you are being discharged home, abdominal pain can be unpredictable. Therefore, you need a repeated exam if your pain does not resolve, returns, or worsens. Most patients with abdominal pain don't have to be admitted to the hospital or have surgery, but serious problems like appendicitis and gallbladder attacks can start out as nonspecific pain. Many abdominal conditions cannot be diagnosed in one visit, so follow-up evaluations are very important.  Your vital signs today were: BP 138/84   Pulse 94   Temp 98.7 F (37.1 C) (Oral)   Resp 18   Ht 4\' 11"  (1.499 m)   Wt 77.1 kg   LMP 02/02/2022   SpO2 100%   BMI 34.34 kg/m  If your blood pressure (bp) was elevated above 135/85 this visit, please have this repeated by your doctor within one month. --------------

## 2022-03-10 NOTE — ED Triage Notes (Signed)
Pt c/o left side abdominal pain; pt states she feels like her abdomen is distended  Hurts worse with movement or breathing

## 2022-03-30 ENCOUNTER — Encounter (INDEPENDENT_AMBULATORY_CARE_PROVIDER_SITE_OTHER): Payer: Self-pay

## 2022-08-18 ENCOUNTER — Ambulatory Visit
Admission: RE | Admit: 2022-08-18 | Discharge: 2022-08-18 | Disposition: A | Discharge status: Home / Self Care | Payer: Medicaid Other | Source: Ambulatory Visit | Attending: Internal Medicine | Admitting: Internal Medicine

## 2022-08-18 ENCOUNTER — Other Ambulatory Visit: Payer: Self-pay | Admitting: Internal Medicine

## 2022-08-18 ENCOUNTER — Ambulatory Visit
Admission: RE | Admit: 2022-08-18 | Discharge: 2022-08-18 | Disposition: A | Discharge status: Home / Self Care | Payer: Medicaid Other | Attending: Internal Medicine | Admitting: Internal Medicine

## 2022-08-18 DIAGNOSIS — M79672 Pain in left foot: Secondary | ICD-10-CM | POA: Insufficient documentation

## 2022-11-17 ENCOUNTER — Ambulatory Visit
Admission: EM | Admit: 2022-11-17 | Discharge: 2022-11-17 | Disposition: A | Discharge status: Home / Self Care | Payer: Medicaid Other | Attending: Physician Assistant | Admitting: Physician Assistant

## 2022-11-17 DIAGNOSIS — Z113 Encounter for screening for infections with a predominantly sexual mode of transmission: Secondary | ICD-10-CM | POA: Insufficient documentation

## 2022-11-17 DIAGNOSIS — N3001 Acute cystitis with hematuria: Secondary | ICD-10-CM | POA: Diagnosis not present

## 2022-11-17 LAB — URINE CULTURE

## 2022-11-17 LAB — URINALYSIS, W/ REFLEX TO CULTURE (INFECTION SUSPECTED)
Bilirubin Urine: NEGATIVE
Glucose, UA: NEGATIVE mg/dL
Nitrite: NEGATIVE
Protein, ur: 100 mg/dL — AB
Specific Gravity, Urine: >1.03 — ABNORMAL HIGH (ref 1.005–1.030)
pH: 6 (ref 5.0–8.0)

## 2022-11-17 LAB — PREGNANCY, URINE: Preg Test, Ur: NEGATIVE

## 2022-11-17 MED ORDER — NITROFURANTOIN MONOHYD MACRO 100 MG PO CAPS
100.0000 mg | ORAL_CAPSULE | Freq: Two times a day (BID) | ORAL | 0 refills | Status: AC
Start: 2022-11-17 — End: 2022-11-24

## 2022-11-17 NOTE — Discharge Instructions (Signed)
Start Macrobid twice daily for 7 days.  The clinic will contact you with results of the urine culture and vaginal swab done today if it is positive. Rest and fluids Follow-up with PCP if symptoms do not improve Please go to the ER if you have any worsening symptoms

## 2022-11-17 NOTE — ED Triage Notes (Signed)
Pt c/o urinary frequency, burning and lower abd pain. Symptoms started yesterday. Pt concerned for UTI

## 2022-11-17 NOTE — ED Provider Notes (Signed)
[facility]    CSN: [id number] Arrival date & time: 11/17/22  0941      History   Chief Complaint Chief Complaint  Patient presents with   Urinary Frequency    Entered by patient   Hematuria    HPI [name] is a 32 y.o. female presents for evaluation of dysuria.  Patient reports 1 day of urinary burning, urgency, frequency, hematuria.  Denies fevers, nausea/vomiting, flank pain.  No vaginal discharge or STD concern but would like screening while here.  Has a history of recurrent UTIs.  Has not taken any OTC medications for symptoms.  No other concerns at this time.   Urinary Frequency  Hematuria    History reviewed. No pertinent past medical history.  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   Blood type O+ 12/08/2018   Irregular contractions 04/18/2015   Postoperative care for cataract 04/18/2015    Past Surgical History:  Procedure Laterality Date   CESAREAN SECTION     CESAREAN SECTION N/A    CESAREAN SECTION N/A 04/18/2015   Procedure: CESAREAN SECTION;  Surgeon: Suzy Bouchard, MD;  Location: [facility] ORS;  Service: Obstetrics;  Laterality: N/A;    OB History     Gravida  4   Para  3   Term  1   Preterm  1   AB  1   Living  3      SAB  1   IAB  0   Ectopic  0   Multiple  0   Live Births  3            Home Medications    Prior to Admission medications   Medication Sig Start Date End Date Taking? Authorizing Provider  nitrofurantoin, macrocrystal-monohydrate, (MACROBID) 100 MG capsule Take 1 capsule (100 mg total) by mouth 2 (two) times daily for 7 days. 11/17/22 11/24/22 Yes Radford Pax, NP  dicyclomine (BENTYL) 20 MG tablet Take 1 tablet (20 mg total) by mouth 2 (two) times daily. 03/10/22   Renne Crigler, PA-C  naproxen (NAPROSYN) 500 MG tablet Take 1 tablet (500 mg total) by mouth 2 (two) times  daily. 03/10/22   Renne Crigler, PA-C  ondansetron (ZOFRAN-ODT) 4 MG disintegrating tablet Take 1 tablet (4 mg total) by mouth every 8 (eight) hours as needed for nausea or vomiting. 03/10/22   Renne Crigler, PA-C    Family History Family History  Adopted: Yes  Problem Relation Age of Onset   Alcohol abuse Mother    Thyroid disease Other     Social History Social History   Tobacco Use   Smoking status: Never   Smokeless tobacco: Never  Vaping Use   Vaping Use: Never used  Substance Use Topics   Alcohol use: No   Drug use: No     Allergies   Patient has no known allergies.   Review of Systems Review of Systems  Genitourinary:  Positive for frequency and hematuria.     Physical Exam Triage Vital Signs ED Triage Vitals  Enc Vitals Group     BP 11/17/22 0957 129/87     Pulse Rate 11/17/22 0957 71     Resp 11/17/22 0957 18     Temp 11/17/22 0957 (!) 97.1 F (36.2 C)     Temp src --      SpO2 11/17/22 0957 100 %  Weight --      Height --      Head Circumference --      Peak Flow --      Pain Score 11/17/22 0954 0     Pain Loc --      Pain Edu? --      Excl. in GC? --    No data found.  Updated Vital Signs BP 129/87   Pulse 71   Temp (!) 97.1 F (36.2 C)   Resp 18   LMP 11/07/2022 (Exact Date)   SpO2 100%   Visual Acuity Right Eye Distance:   Left Eye Distance:   Bilateral Distance:    Right Eye Near:   Left Eye Near:    Bilateral Near:     Physical Exam Vitals and nursing note reviewed.  Constitutional:      Appearance: Normal appearance.  HENT:     Head: Normocephalic and atraumatic.  Eyes:     Pupils: Pupils are equal, round, and reactive to light.  Cardiovascular:     Rate and Rhythm: Normal rate.  Pulmonary:     Effort: Pulmonary effort is normal.  Neurological:     General: No focal deficit present.     Mental Status: She is alert and oriented to person, place, and time.  Psychiatric:        Mood and Affect: Mood normal.         Behavior: Behavior normal.      UC Treatments / Results  Labs (all labs ordered are listed, but only abnormal results are displayed) Labs Reviewed  URINALYSIS, W/ REFLEX TO CULTURE (INFECTION SUSPECTED) - Abnormal; Notable for the following components:      Result Value   APPearance HAZY (*)    Specific Gravity, Urine >1.030 (*)    Hgb urine dipstick LARGE (*)    Ketones, ur TRACE (*)    Protein, ur 100 (*)    Leukocytes,Ua SMALL (*)    Bacteria, UA FEW (*)    All other components within normal limits  URINE CULTURE  PREGNANCY, URINE  CERVICOVAGINAL ANCILLARY ONLY    EKG   Radiology No results found.  Procedures Procedures (including critical care time)  Medications Ordered in UC Medications - No data to display  Initial Impression / Assessment and Plan / UC Course  I have reviewed the triage vital signs and the nursing notes.  Pertinent labs & imaging results that were available during my care of the patient were reviewed by me and considered in my medical decision making (see chart for details).     Reviewed exam and symptoms with patient.  No red flags.  UA positive for UTI, will culture and start Macrobid STD testing is ordered and will contact for any positive results Rest and fluids PCP follow-up if symptoms do not improve and ER precautions reviewed and patient verbalized understanding Final Clinical Impressions(s) / UC Diagnoses   Final diagnoses:  Screening examination for STD (sexually transmitted disease)  Acute cystitis with hematuria     Discharge Instructions      Start Macrobid twice daily for 7 days.  The clinic will contact you with results of the urine culture and vaginal swab done today if it is positive. Rest and fluids Follow-up with PCP if symptoms do not improve Please go to the ER if you have any worsening symptoms     ED Prescriptions     Medication Sig Dispense Auth. Provider   nitrofurantoin,  macrocrystal-monohydrate, (MACROBID)  100 MG capsule Take 1 capsule (100 mg total) by mouth 2 (two) times daily for 7 days. 14 capsule Radford Pax, NP      PDMP not reviewed this encounter.   Radford Pax, NP 11/17/22 1048

## 2022-11-18 LAB — CERVICOVAGINAL ANCILLARY ONLY
Chlamydia: NEGATIVE
Comment: NEGATIVE
Comment: NEGATIVE
Comment: NORMAL
Neisseria Gonorrhea: NEGATIVE
Trichomonas: NEGATIVE

## 2022-11-19 LAB — URINE CULTURE: Culture: 80000 — AB

## 2022-11-20 ENCOUNTER — Telehealth (HOSPITAL_COMMUNITY): Payer: Self-pay | Admitting: Emergency Medicine

## 2022-11-20 MED ORDER — CEPHALEXIN 500 MG PO CAPS: 500.0000 mg | ORAL_CAPSULE | Freq: Two times a day (BID) | ORAL | 0 refills | Status: AC

## 2022-11-21 ENCOUNTER — Telehealth: Payer: Self-pay

## 2022-11-21 MED ORDER — SULFAMETHOXAZOLE-TRIMETHOPRIM 800-160 MG PO TABS: 1.0000 | ORAL_TABLET | Freq: Two times a day (BID) | ORAL | 0 refills | Status: DC

## 2022-11-21 NOTE — Telephone Encounter (Signed)
Per protocol pt to d/c Macrobid and begin tx with Bactrim. Attempted to reach patient x1. LMV. Rx sent to [facility] on file.

## 2022-11-23 ENCOUNTER — Telehealth (HOSPITAL_COMMUNITY): Payer: Self-pay | Admitting: Emergency Medicine

## 2022-11-23 NOTE — Telephone Encounter (Signed)
Reviewed recent urine culture results and the need to finish Cephalexin.  Patient never picked up Bactrim.  She is better

## 2022-12-21 ENCOUNTER — Encounter (HOSPITAL_COMMUNITY): Payer: Self-pay

## 2022-12-21 ENCOUNTER — Other Ambulatory Visit: Payer: Self-pay

## 2022-12-21 ENCOUNTER — Emergency Department (HOSPITAL_COMMUNITY)
Admission: EM | Admit: 2022-12-21 | Discharge: 2022-12-21 | Disposition: A | Discharge status: Home / Self Care | Payer: Medicaid Other | Attending: Emergency Medicine | Admitting: Emergency Medicine

## 2022-12-21 DIAGNOSIS — L03818 Cellulitis of other sites: Secondary | ICD-10-CM | POA: Insufficient documentation

## 2022-12-21 DIAGNOSIS — L0390 Cellulitis, unspecified: Secondary | ICD-10-CM

## 2022-12-21 DIAGNOSIS — W57XXXA Bitten or stung by nonvenomous insect and other nonvenomous arthropods, initial encounter: Secondary | ICD-10-CM | POA: Insufficient documentation

## 2022-12-21 MED ORDER — CEPHALEXIN 500 MG PO CAPS: 1000.0000 mg | ORAL_CAPSULE | Freq: Two times a day (BID) | ORAL | 0 refills | Status: DC

## 2022-12-21 MED ORDER — CEPHALEXIN 500 MG PO CAPS
1000.0000 mg | ORAL_CAPSULE | Freq: Once | ORAL | Status: AC
  Administered 2022-12-21: 1000 mg via ORAL
  Filled 2022-12-21: qty 2

## 2022-12-21 MED ORDER — TETANUS-DIPHTH-ACELL PERTUSSIS 5-2.5-18.5 LF-MCG/0.5 IM SUSY: 0.5000 mL | PREFILLED_SYRINGE | Freq: Once | INTRAMUSCULAR | Status: DC

## 2022-12-21 NOTE — ED Notes (Signed)
Wound cleaned and dressing applied pt educated not to pick at area. Also verbalizes d/c teaching and Rx pick. Ambulatory at D/C

## 2022-12-21 NOTE — Discharge Instructions (Signed)
Evaluation today revealed that you have cellulitis secondary to your bug bite.  Starting on Keflex which is an antibiotic please take the entire course and follow-up with your PCP.  If the area of swelling and redness increases, you develop a fever or starts to ooze pustulant discharge or any other concerning symptom please return emergency department further evaluation.Marland Kitchen

## 2022-12-21 NOTE — ED Provider Notes (Addendum)
EMERGENCY DEPARTMENT AT [facility] Provider Note   CSN: [id number] Arrival date & time: 12/21/22  1136     History  Chief Complaint  Patient presents with   Insect Bite   HPI [name] is a 32 y.o. female presenting for insect bite.  States she noticed it on Wednesday of this past week.  States she was night fishing and when she got home she felt a "bump" just below the right ear.  The lesion was initially itchy but now more painful and seems to be getting larger.  Denies fever and chills. Reports last Tdap booster was in January of this year.   HPI     Home Medications Prior to Admission medications   Medication Sig Start Date End Date Taking? Authorizing Provider  dicyclomine (BENTYL) 20 MG tablet Take 1 tablet (20 mg total) by mouth 2 (two) times daily. 03/10/22   Renne Crigler, PA-C  naproxen (NAPROSYN) 500 MG tablet Take 1 tablet (500 mg total) by mouth 2 (two) times daily. 03/10/22   Renne Crigler, PA-C  ondansetron (ZOFRAN-ODT) 4 MG disintegrating tablet Take 1 tablet (4 mg total) by mouth every 8 (eight) hours as needed for nausea or vomiting. 03/10/22   Renne Crigler, PA-C  sulfamethoxazole-trimethoprim (BACTRIM DS) 800-160 MG tablet Take 1 tablet by mouth 2 (two) times daily for 3 days. 11/21/22 11/24/22  Merrilee Jansky, MD      Allergies    Patient has no known allergies.    Review of Systems   See HPI for pertinent positives   Physical Exam   Vitals:   12/21/22 1145  BP: 135/82  Pulse: 89  Resp: 16  Temp: 98.8 F (37.1 C)  SpO2: 97%    CONSTITUTIONAL:  well-appearing, NAD NEURO:  Alert and oriented x 3, CN 3-12 grossly intact EYES:  eyes equal and reactive ENT/NECK:  Supple, no stridor  CARDIO: appears well-perfused  PULM:  No respiratory distress GI/GU:  non-distended MSK/SPINE:  No gross deformities, no edema, moves all extremities  SKIN: Punctate lesion noted just below the right ear with circumferential induration  and erythema, no palpable fluctuance.  Investigated with ultrasound which revealed subcutaneous cobblestoning but no abscess.  *Additional and/or pertinent findings included in MDM below   ED Results / Procedures / Treatments   Labs (all labs ordered are listed, but only abnormal results are displayed) Labs Reviewed - No data to display  EKG None  Radiology No results found.  Procedures Procedures    Medications Ordered in ED Medications  cephALEXin (KEFLEX) capsule 1,000 mg (has no administration in time range)    ED Course/ Medical Decision Making/ A&P                             Medical Decision Making Risk Prescription drug management.   32 year old well-appearing female presenting for insect bite.  Exam findings suggestive of a cellulitis.  Findings do not indicate abscess.  Treating for cellulitis.  Patient appears clinically well with stable vitals, have low suspicion for more systemic infection.  Cleaned wound.  Started on Keflex.  Advised to follow-up with PCP.  Sent Keflex to her pharmacy.  Vital stable.  Discussed pertinent return precautions. Discharged home in good condition.        Final Clinical Impression(s) / ED Diagnoses Final diagnoses:  Cellulitis, unspecified cellulitis site    Rx / DC Orders ED Discharge Orders  None         Gareth Eagle, PA-C 12/21/22 1301    Gareth Eagle, PA-C 12/21/22 1312    Vanetta Mulders, MD 12/23/22 [phone]

## 2022-12-21 NOTE — ED Triage Notes (Signed)
Bite to the back of neck after night fishing Hot to touch per pt Pt doesn't know what bit her

## 2023-09-13 ENCOUNTER — Other Ambulatory Visit: Payer: Self-pay

## 2023-09-13 ENCOUNTER — Emergency Department
Admission: EM | Admit: 2023-09-13 | Discharge: 2023-09-13 | Disposition: A | Discharge status: Home / Self Care | Attending: Emergency Medicine | Admitting: Emergency Medicine

## 2023-09-13 DIAGNOSIS — R109 Unspecified abdominal pain: Secondary | ICD-10-CM | POA: Diagnosis present

## 2023-09-13 DIAGNOSIS — R140 Abdominal distension (gaseous): Secondary | ICD-10-CM | POA: Diagnosis not present

## 2023-09-13 LAB — COMPREHENSIVE METABOLIC PANEL WITH GFR
ALT: 14 U/L (ref 0–44)
AST: 15 U/L (ref 15–41)
Albumin: 4 g/dL (ref 3.5–5.0)
Alkaline Phosphatase: 75 U/L (ref 38–126)
Anion gap: 7 (ref 5–15)
BUN: 19 mg/dL (ref 6–20)
CO2: 22 mmol/L (ref 22–32)
Calcium: 8.6 mg/dL — ABNORMAL LOW (ref 8.9–10.3)
Chloride: 108 mmol/L (ref 98–111)
Creatinine, Ser: 0.64 mg/dL (ref 0.44–1.00)
GFR, Estimated: >60 mL/min (ref >60)
Glucose, Bld: 119 mg/dL — ABNORMAL HIGH (ref 70–99)
Potassium: 4 mmol/L (ref 3.5–5.1)
Sodium: 137 mmol/L (ref 135–145)
Total Bilirubin: 0.8 mg/dL (ref 0.0–1.2)
Total Protein: 7.6 g/dL (ref 6.5–8.1)

## 2023-09-13 LAB — URINALYSIS, ROUTINE W REFLEX MICROSCOPIC
Bacteria, UA: NONE SEEN
Bilirubin Urine: NEGATIVE
Glucose, UA: NEGATIVE mg/dL
Ketones, ur: NEGATIVE mg/dL
Leukocytes,Ua: NEGATIVE
Nitrite: NEGATIVE
Protein, ur: NEGATIVE mg/dL
Specific Gravity, Urine: 1.017 (ref 1.005–1.030)
pH: 5 (ref 5.0–8.0)

## 2023-09-13 LAB — LIPASE, BLOOD: Lipase: 34 U/L (ref 11–51)

## 2023-09-13 LAB — CBC
HCT: 40.1 % (ref 36.0–46.0)
Hemoglobin: 13.3 g/dL (ref 12.0–15.0)
MCH: 31.7 pg (ref 26.0–34.0)
MCHC: 33.2 g/dL (ref 30.0–36.0)
MCV: 95.7 fL (ref 80.0–100.0)
Platelets: 277 10*3/uL (ref 150–400)
RBC: 4.19 MIL/uL (ref 3.87–5.11)
RDW: 11.5 % (ref 11.5–15.5)
WBC: 8.5 10*3/uL (ref 4.0–10.5)
nRBC: 0 % (ref 0.0–0.2)

## 2023-09-13 LAB — POC URINE PREG, ED: Preg Test, Ur: NEGATIVE

## 2023-09-13 NOTE — ED Triage Notes (Signed)
 Pt presents to ED with c/o of ABD pain. Pt states it has been ongoing for 2 days. Pt states pain in bilateral flank pain, HX of UTI's

## 2023-09-13 NOTE — ED Provider Notes (Signed)
 [facility] Provider Note    Event Date/Time   First MD Initiated Contact with Patient 09/13/23 1315     (approximate)   History   Abdominal Pain   HPI  [name] is a 33 y.o. female who presents to the ED for evaluation of Abdominal Pain   Thayer Ohm your PCP visit from September.  Obese patient with history of anxiety, Nexplanon  Patient presented to the ED for abdominal bloating sensation, reports intermittent cramping, bloating and nausea over the past few weeks, particularly after eating certain meals like a lot of soft drinks or [employer].  Denies any urinary or stool symptoms such as dysuria, hematuria, diarrhea, hematochezia or emesis.   Physical Exam   Triage Vital Signs: ED Triage Vitals  Encounter Vitals Group     BP 09/13/23 1120 (!) 146/88     Systolic BP Percentile --      Diastolic BP Percentile --      Pulse Rate 09/13/23 1120 82     Resp 09/13/23 1120 18     Temp 09/13/23 1120 98.5 F (36.9 C)     Temp Source 09/13/23 1120 Oral     SpO2 09/13/23 1120 100 %     Weight 09/13/23 1121 179 lb (81.2 kg)     Height 09/13/23 1121 5' (1.524 m)     Head Circumference --      Peak Flow --      Pain Score 09/13/23 1120 8     Pain Loc --      Pain Education --      Exclude from Growth Chart --     Most recent vital signs: Vitals:   09/13/23 1120  BP: (!) 146/88  Pulse: 82  Resp: 18  Temp: 98.5 F (36.9 C)  SpO2: 100%    General: Awake, no distress.  CV:  Good peripheral perfusion.  Resp:  Normal effort.  Abd:  No distention.  Soft and benign without tenderness, guarding or peritoneal features MSK:  No deformity noted.  Neuro:  No focal deficits appreciated. Other:     ED Results / Procedures / Treatments   Labs (all labs ordered are listed, but only abnormal results are displayed) Labs Reviewed  COMPREHENSIVE METABOLIC PANEL WITH GFR - Abnormal; Notable for the following components:      Result Value   Glucose,  Bld 119 (*)    Calcium 8.6 (*)    All other components within normal limits  URINALYSIS, ROUTINE W REFLEX MICROSCOPIC - Abnormal; Notable for the following components:   Color, Urine YELLOW (*)    APPearance HAZY (*)    Hgb urine dipstick MODERATE (*)    All other components within normal limits  LIPASE, BLOOD  CBC  POC URINE PREG, ED    EKG   RADIOLOGY   Official radiology report(s): No results found.  PROCEDURES and INTERVENTIONS:  Procedures  Medications - No data to display   IMPRESSION / MDM / ASSESSMENT AND PLAN / ED COURSE  I reviewed the triage vital signs and the nursing notes.  Differential diagnosis includes, but is not limited to, IBS, appendicitis, UTI, pregnancy, no pathology  {Patient presents with symptoms of an acute illness or injury that is potentially life-threatening.  Patient presents with intermittent subacute abdominal bloating and nausea without evidence of acute pathology and suitable for outpatient management.  Normal exam and reassuring workup with clear urine, no pregnancy, normal CBC, metabolic panel and lipase.  I  considered CT imaging for this patient but considering her lack of symptoms now or tenderness I think this is necessary at this point.  We discussed ED return precautions and lifestyle changes.      FINAL CLINICAL IMPRESSION(S) / ED DIAGNOSES   Final diagnoses:  Abdominal bloating     Rx / DC Orders   ED Discharge Orders     None        Note:  This document was prepared using Dragon voice recognition software and may include unintentional dictation errors.   Arline Bennett, MD 09/13/23 [id number]

## 2023-09-13 NOTE — ED Notes (Signed)
 See triage note  Presents with some upper abd pain  States pain started 2 days ago States some nausea  States feels like "gas"  Last BM was 2 days ago
